# Patient Record
Sex: Male | Born: 1965
Health system: Southern US, Community
[De-identification: ages and names within clinical notes are randomized; demographics above are authoritative.]

## PROBLEM LIST (undated history)

## (undated) DIAGNOSIS — R519 Headache, unspecified: Secondary | ICD-10-CM

## (undated) DIAGNOSIS — G8929 Other chronic pain: Secondary | ICD-10-CM

## (undated) DIAGNOSIS — F329 Major depressive disorder, single episode, unspecified: Secondary | ICD-10-CM

## (undated) DIAGNOSIS — R51 Headache: Secondary | ICD-10-CM

## (undated) DIAGNOSIS — F32A Depression, unspecified: Secondary | ICD-10-CM

## (undated) DIAGNOSIS — G43109 Migraine with aura, not intractable, without status migrainosus: Secondary | ICD-10-CM

## (undated) DIAGNOSIS — F325 Major depressive disorder, single episode, in full remission: Secondary | ICD-10-CM

## (undated) DIAGNOSIS — L409 Psoriasis, unspecified: Secondary | ICD-10-CM

## (undated) HISTORY — DX: Major depressive disorder, single episode, unspecified: F32.9

## (undated) HISTORY — DX: Headache: R51

## (undated) HISTORY — DX: Depression, unspecified: F32.A

## (undated) HISTORY — DX: Headache, unspecified: R51.9

---

## 2008-06-14 ENCOUNTER — Emergency Department (HOSPITAL_BASED_OUTPATIENT_CLINIC_OR_DEPARTMENT_OTHER): Admission: EM | Admit: 2008-06-14 | Discharge: 2008-06-14 | Payer: Self-pay | Admitting: Emergency Medicine

## 2008-08-03 ENCOUNTER — Encounter: Admission: RE | Admit: 2008-08-03 | Discharge: 2008-08-03 | Payer: Self-pay | Admitting: Family Medicine

## 2008-12-27 IMAGING — CR DG SHOULDER 2+V*L*
3 series · 3 of 3 positions shown · non-contrast
Comparison: None

CLINICAL DATA: Pain, no recent trauma

LEFT SHOULDER - 2+ VIEW

[w shoulder ap internal left]
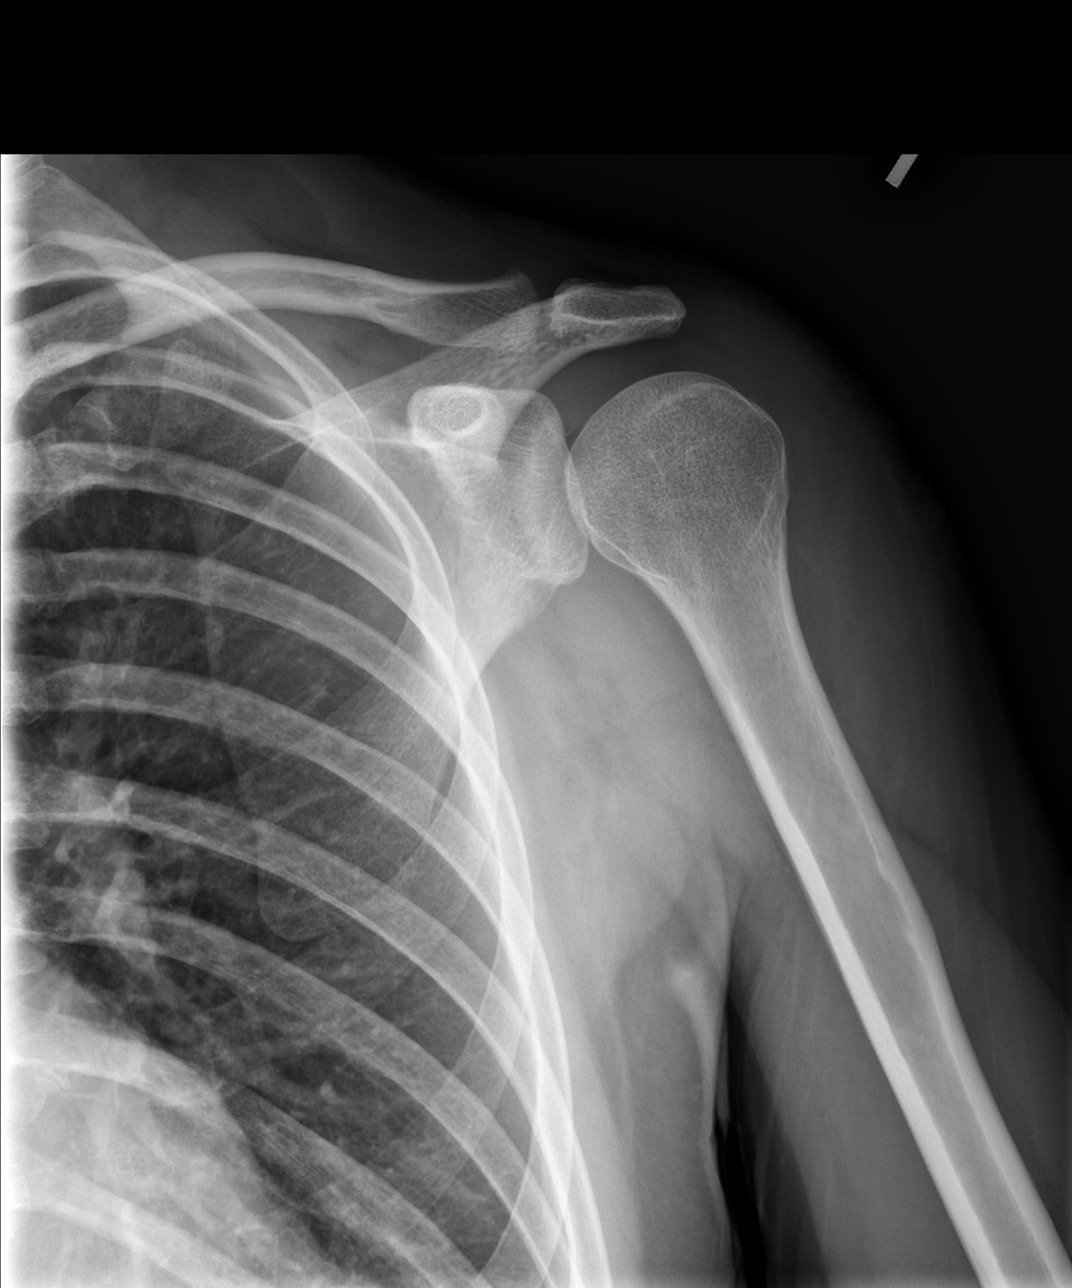

[w shoulder ap external left]
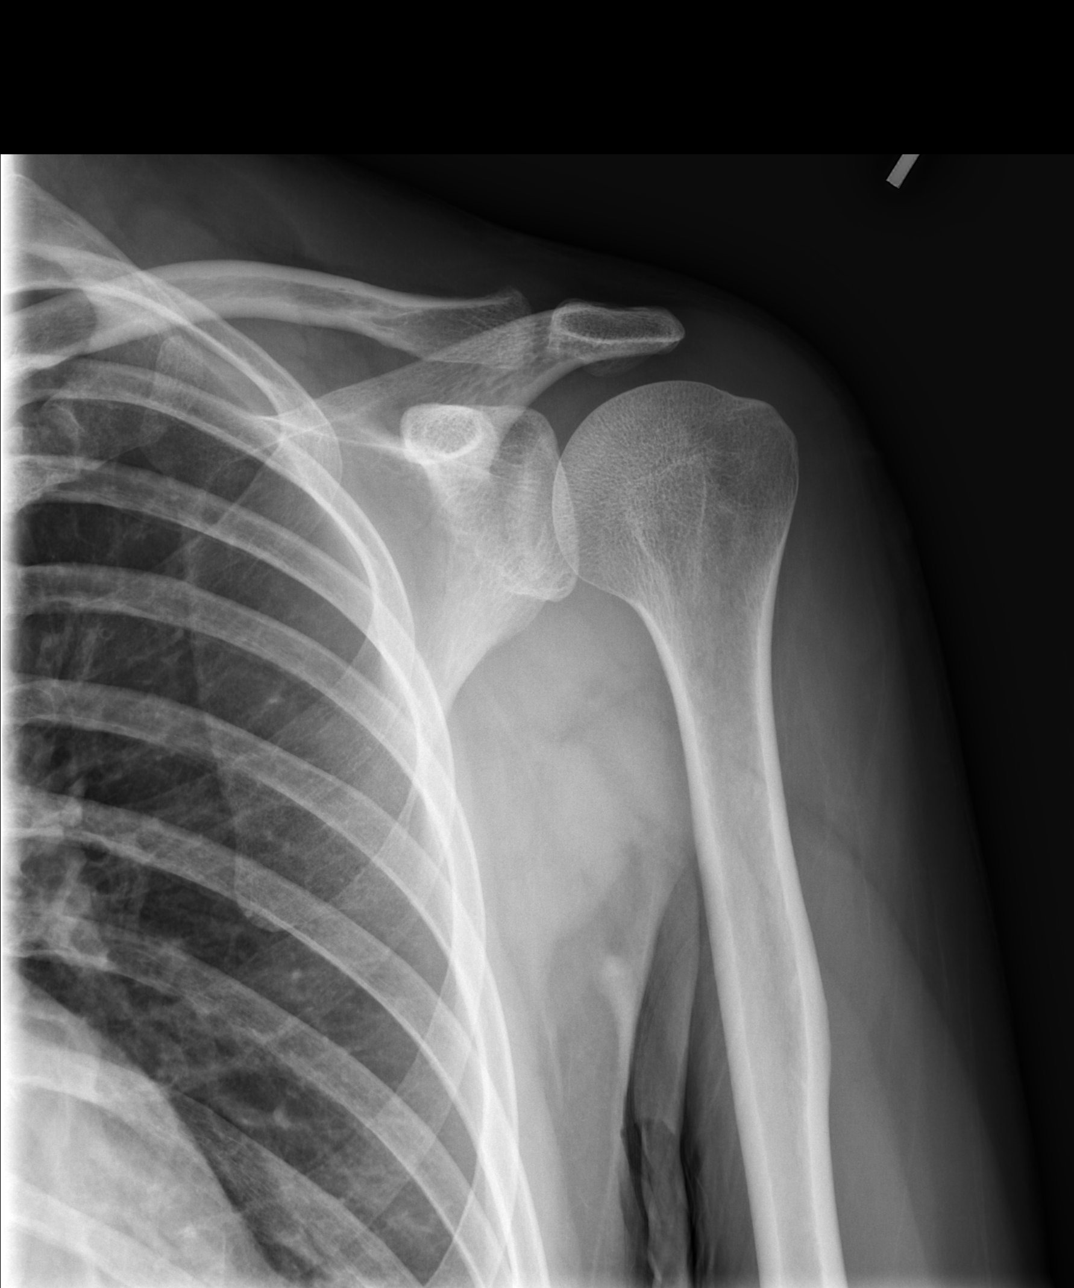

[w shoulder axillary left *]
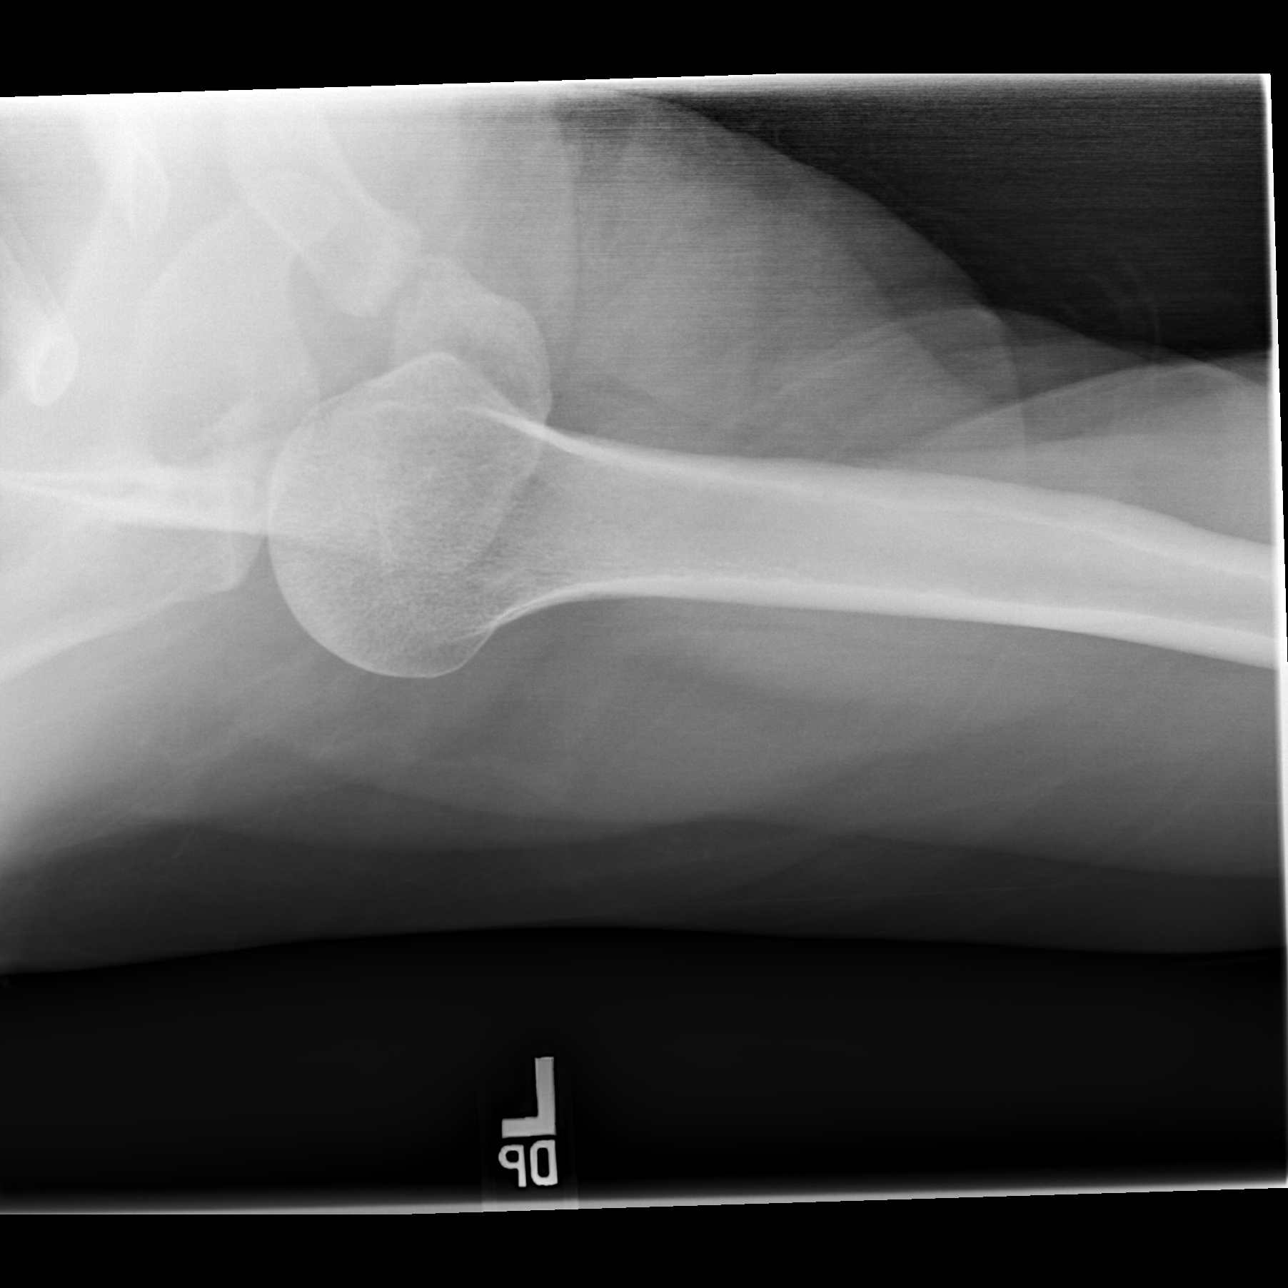

[3 of 3 positions shown; findings below may reference images not displayed]

FINDINGS: The glenohumeral joint space appears normal.  The AC
joint is normally aligned.  No acute bony abnormality is seen
IMPRESSION: .  Negative

## 2011-12-11 ENCOUNTER — Encounter: Payer: Self-pay | Admitting: Internal Medicine

## 2011-12-11 ENCOUNTER — Telehealth: Payer: Self-pay | Admitting: Internal Medicine

## 2011-12-11 ENCOUNTER — Ambulatory Visit (INDEPENDENT_AMBULATORY_CARE_PROVIDER_SITE_OTHER): Payer: Self-pay | Admitting: Internal Medicine

## 2011-12-11 VITALS — BP 110/80 | HR 75 | Temp 98.5°F | Resp 18 | Ht 69.0 in | Wt 197.0 lb

## 2011-12-11 DIAGNOSIS — Z0000 Encounter for general adult medical examination without abnormal findings: Secondary | ICD-10-CM

## 2011-12-11 DIAGNOSIS — J329 Chronic sinusitis, unspecified: Secondary | ICD-10-CM

## 2011-12-11 DIAGNOSIS — F329 Major depressive disorder, single episode, unspecified: Secondary | ICD-10-CM

## 2011-12-11 DIAGNOSIS — F3289 Other specified depressive episodes: Secondary | ICD-10-CM

## 2011-12-11 DIAGNOSIS — F32A Depression, unspecified: Secondary | ICD-10-CM | POA: Insufficient documentation

## 2011-12-11 HISTORY — PX: NO PAST SURGERIES: SHX2092

## 2011-12-11 MED ORDER — FLUTICASONE PROPIONATE 50 MCG/ACT NA SUSP: 2.0000 | Freq: Every day | NASAL | Status: DC

## 2011-12-11 MED ORDER — LEVOFLOXACIN 500 MG PO TABS: 500.0000 mg | ORAL_TABLET | Freq: Every day | ORAL | Status: AC

## 2011-12-11 NOTE — Assessment & Plan Note (Signed)
Attempt course of levaquin and flonase. Followup if no improvement or worsening.

## 2011-12-11 NOTE — Patient Instructions (Signed)
Please schedule a physical at your convenience with fasting labs Cbc, chem7, a1c, lipid, lft, tsh, ua with reflex v70.0

## 2011-12-11 NOTE — Progress Notes (Signed)
  Subjective:    Patient ID: Bradley Richards, male    DOB: 1965/12/28, 46 y.o.   MRN: [id number]  HPI Pt presents to clinic for evaluation of possible sinus infection. Was treated in January for sinusitis with zpak with subsequent but no resolution of sx's. Sinus pressure/pain resolved. Now notes over one week hx of ST with frontal headaches. No fever, chills or teeth pain. Has recent mild cough productive for tan sputum without hemoptysis.  Headaches have occurred over a six month period but are worse during sinus flares. Previously attempted one month of nasonex during sinusitis but none recently. No other alleviating or exacerbating factors. Wishes to schedule physical in the future.  Past Medical History  Diagnosis Date  . Depression     no counseling  . Head ache    Past Surgical History  Procedure Date  . No past surgeries 12/11/2011    reports that he has never smoked. He has never used smokeless tobacco. He reports that he does not drink alcohol or use illicit drugs. family history includes Alcohol abuse in an unspecified family member; Heart disease in his father; Hypothyroidism in his mother; Prostate cancer in his paternal uncle; and Stroke (age of onset:70) in his mother.  There is no history of Hypertension, and Diabetes, and Colon cancer, . No Known Allergies   Review of Systems  Constitutional: Negative for fever and chills.  HENT: Positive for congestion and sore throat. Negative for ear pain and sinus pressure.   Respiratory: Positive for cough.   Neurological: Positive for headaches.  All other systems reviewed and are negative.       Objective:   Physical Exam  Nursing note and vitals reviewed. Constitutional: He appears well-developed and well-nourished. No distress.  HENT:  Head: Normocephalic and atraumatic.  Right Ear: External ear normal.  Left Ear: External ear normal.  Nose: Nose normal.  Mouth/Throat: Oropharynx is clear and moist. No oropharyngeal  exudate.  Eyes: Conjunctivae and EOM are normal. Right eye exhibits no discharge. Left eye exhibits no discharge. No scleral icterus.  Neck: Neck supple.  Pulmonary/Chest: Effort normal and breath sounds normal. No respiratory distress. He has no wheezes. He has no rales.  Lymphadenopathy:    He has no cervical adenopathy.  Neurological: He is alert.  Skin: He is not diaphoretic.  Psychiatric: He has a normal mood and affect.          Assessment & Plan:

## 2011-12-12 NOTE — Telephone Encounter (Signed)
Lab orders entered for April 2013. 

## 2011-12-26 ENCOUNTER — Telehealth: Payer: Self-pay | Admitting: *Deleted

## 2011-12-26 MED ORDER — LEVOFLOXACIN 500 MG PO TABS: 500.0000 mg | ORAL_TABLET | Freq: Every day | ORAL | Status: AC

## 2011-12-26 NOTE — Telephone Encounter (Signed)
Patients wife called and left voice message stating patient was seen for a sinus infection about a week. His symptoms had improved with use of antibiotics. Her message states patient has completed the antibiotics and his symptoms are back. She stated that patient is complaining of sinus congestion and headaches. She stated patient would like to know if he could get another round of antibiotics. He is scheduled to follow up on Monday 01/01/2012 for a physical.

## 2011-12-26 NOTE — Telephone Encounter (Signed)
Ok to repeat levaquin course. Cont flonase. Followup if no improvement or worsening.

## 2011-12-26 NOTE — Telephone Encounter (Signed)
Call placed to patient at  [phone], he was advised per Dr Rodena Medin instructions, and has verbalized understanding.

## 2012-01-01 ENCOUNTER — Encounter: Payer: Self-pay | Admitting: Internal Medicine

## 2012-01-02 ENCOUNTER — Telehealth: Payer: Self-pay | Admitting: Internal Medicine

## 2012-01-02 LAB — BASIC METABOLIC PANEL
BUN: 15 mg/dL (ref 6–23)
Chloride: 105 mEq/L (ref 96–112)
Glucose, Bld: 94 mg/dL (ref 70–99)
Sodium: 140 mEq/L (ref 135–145)

## 2012-01-02 LAB — CBC
Hemoglobin: 14.2 g/dL (ref 13.0–17.0)
MCHC: 34.7 g/dL (ref 30.0–36.0)
RDW: 12.9 % (ref 11.5–15.5)

## 2012-01-02 LAB — TSH: TSH: 2.793 u[IU]/mL (ref 0.350–4.500)

## 2012-01-02 NOTE — Telephone Encounter (Signed)
Pt presented to the lab, future order released and given to the lab. 

## 2012-01-02 NOTE — Telephone Encounter (Signed)
Addended by: Mervin Kung A on: 01/02/2012 09:09 AM   Modules accepted: Orders

## 2012-01-02 NOTE — Telephone Encounter (Signed)
Received medical records from [facility]. Lupe Carney  P: [phone] F: [phone]

## 2012-01-03 LAB — LIPID PANEL
LDL Cholesterol: 127 mg/dL — ABNORMAL HIGH (ref 0–99)
Total CHOL/HDL Ratio: 5.2 Ratio
Triglycerides: 117 mg/dL (ref <150)
VLDL: 23 mg/dL (ref 0–40)

## 2012-01-03 LAB — HEPATIC FUNCTION PANEL
AST: 18 U/L (ref 0–37)
Albumin: 4.4 g/dL (ref 3.5–5.2)
Alkaline Phosphatase: 62 U/L (ref 39–117)
Bilirubin, Direct: 0.1 mg/dL (ref 0.0–0.3)
Indirect Bilirubin: 0.3 mg/dL (ref 0.0–0.9)

## 2012-01-03 LAB — URINALYSIS, ROUTINE W REFLEX MICROSCOPIC
Glucose, UA: NEGATIVE mg/dL
Nitrite: NEGATIVE

## 2012-01-04 ENCOUNTER — Encounter: Payer: 59 | Admitting: Internal Medicine

## 2012-01-16 ENCOUNTER — Ambulatory Visit (INDEPENDENT_AMBULATORY_CARE_PROVIDER_SITE_OTHER): Payer: 59 | Admitting: Internal Medicine

## 2012-01-16 ENCOUNTER — Encounter: Payer: Self-pay | Admitting: Internal Medicine

## 2012-01-16 VITALS — BP 100/64 | HR 65 | Temp 97.9°F | Resp 16 | Ht 69.0 in | Wt 196.0 lb

## 2012-01-16 DIAGNOSIS — Z23 Encounter for immunization: Secondary | ICD-10-CM

## 2012-01-16 DIAGNOSIS — Z0000 Encounter for general adult medical examination without abnormal findings: Secondary | ICD-10-CM

## 2012-01-16 DIAGNOSIS — H6121 Impacted cerumen, right ear: Secondary | ICD-10-CM

## 2012-01-16 DIAGNOSIS — H6120 Impacted cerumen, unspecified ear: Secondary | ICD-10-CM

## 2012-01-16 NOTE — Patient Instructions (Signed)
Please schedule fasting cpe labs prior to next visit v70.0

## 2012-02-29 ENCOUNTER — Telehealth: Payer: Self-pay | Admitting: Internal Medicine

## 2012-02-29 MED ORDER — SERTRALINE HCL 50 MG PO TABS: 50.0000 mg | ORAL_TABLET | Freq: Every day | ORAL | Status: DC

## 2012-02-29 NOTE — Telephone Encounter (Signed)
Call placed to patient at [phone], he was informed of Rx approval to pharmacy. He was advised when he gets back in town to have his Rx's transferred to his local pharmacy. Patient verbalized understanding and agrees as instructed.

## 2012-02-29 NOTE — Telephone Encounter (Signed)
[id number] 

## 2012-02-29 NOTE — Telephone Encounter (Signed)
Patient was prescribed this from his former doctor he has no more refills on that rx.  He is out of town and out of meds.  Please call rx in to [facility] [place] [phone]

## 2012-03-11 ENCOUNTER — Ambulatory Visit (INDEPENDENT_AMBULATORY_CARE_PROVIDER_SITE_OTHER): Payer: 59 | Admitting: Internal Medicine

## 2012-03-11 VITALS — BP 98/80 | HR 61 | Temp 98.4°F | Wt 198.0 lb

## 2012-03-11 DIAGNOSIS — J029 Acute pharyngitis, unspecified: Secondary | ICD-10-CM

## 2012-03-11 DIAGNOSIS — R51 Headache: Secondary | ICD-10-CM

## 2012-03-11 DIAGNOSIS — G8929 Other chronic pain: Secondary | ICD-10-CM

## 2012-03-11 MED ORDER — TOPIRAMATE 25 MG PO TABS: ORAL_TABLET | ORAL | Status: DC

## 2012-03-11 NOTE — Progress Notes (Signed)
  Subjective:    Patient ID: Bradley Richards, male    DOB: 1965/10/16, 46 y.o.   MRN: [id number]  HPI Pt presents to clinic for evaluation of headaches. Notes 8-9 month h/o ha worsening over the past 2 months. No neurologic sx's and do not wake him up at night. Had eye exam during this period. Severity ranges from dull to 6/10. Has rare associated nausea. Location primarily frontal without sinus/rhinitis sx's. Also has 2d h/o ST and myalgias. No f/c or strep exposure.   Past Medical History  Diagnosis Date  . Depression     no counseling  . Head ache    Past Surgical History  Procedure Date  . No past surgeries 12/11/2011    reports that he has never smoked. He has never used smokeless tobacco. He reports that he does not drink alcohol or use illicit drugs. family history includes Alcohol abuse in an unspecified family member; Heart disease in his father; Hypothyroidism in his mother; Prostate cancer in his paternal uncle; and Stroke (age of onset:70) in his mother.  There is no history of Hypertension, and Diabetes, and Colon cancer, . No Known Allergies   Review of Systems see hpi     Objective:   Physical Exam  Nursing note and vitals reviewed. Constitutional: He is oriented to person, place, and time. He appears well-developed and well-nourished. No distress.  HENT:  Head: Normocephalic and atraumatic.  Right Ear: External ear normal.  Left Ear: External ear normal.  Nose: Nose normal.  Mouth/Throat: Mucous membranes are normal. Posterior oropharyngeal erythema present. No posterior oropharyngeal edema or tonsillar abscesses.  Eyes: Conjunctivae and EOM are normal. Pupils are equal, round, and reactive to light.  Neck: Neck supple.  Neurological: He is alert and oriented to person, place, and time. No cranial nerve deficit. Coordination normal.  Skin: Skin is warm and dry. He is not diaphoretic.  Psychiatric: He has a normal mood and affect.          Assessment & Plan:

## 2012-03-16 ENCOUNTER — Encounter: Payer: Self-pay | Admitting: Internal Medicine

## 2012-03-16 DIAGNOSIS — J029 Acute pharyngitis, unspecified: Secondary | ICD-10-CM | POA: Insufficient documentation

## 2012-03-16 DIAGNOSIS — G8929 Other chronic pain: Secondary | ICD-10-CM | POA: Insufficient documentation

## 2012-03-16 NOTE — Assessment & Plan Note (Signed)
Rapid strep obtained and neg. Followup if no improvement or worsening.

## 2012-03-16 NOTE — Assessment & Plan Note (Signed)
Neurologically nonfocal. Improve sleep and stress levels. Reduce caffeine. Begin topamax. Schedule close f/u.

## 2012-03-18 ENCOUNTER — Telehealth: Payer: Self-pay | Admitting: *Deleted

## 2012-03-18 NOTE — Telephone Encounter (Signed)
[facility] Triage Call Report Triage Record Num: [id number] Operator: Tomasita Crumble Patient Name: Bradley Richards Call Date & Time: 03/16/2012 11:35:01AM Patient Phone: [phone] PCP: Marguarite Arbour Patient Gender: Male PCP Fax : [phone] Patient DOB: Nov 10, 1965 Practice Name: [facility] [place] [facility] Reason for Call: Caller: [name]/Patient; PCP: Marguarite Arbour; CB#: [phone]; Call regarding Patient wants something called in for sinus infection. On topamax for headaches. States he is currently in [place], [place] and has sinus congestion. Excedrin does not relieve headache. Onset 03/14/12. Clear to tan sputum. Advised see in 24 hours per URI protocol. Per office instructions patient needs evaluation. Advised urgent care facility or go to [facility] location before 1300. Home care for the interim and parameters for callback given. Caller states he is out of state and will seek care elsewhere. Protocol(s) Used: Upper Respiratory Infection (URI) Recommended Outcome per Protocol: See Provider within 24 hours Reason for Outcome: Productive cough with colored sputum (other than clear or white sputum) Care Advice: ~ Use a cool mist humidifier to moisten air. Be sure to clean according to manufacturer's instructions. ~ May inhale steam from hot shower or heated water. Be careful to avoid burns. Increase fluids to 8-12 eight oz (1.6 to 2.4 liters) glasses per day, half of them to be water. Soups, popsicles, fruit juices, non-caffeinated sodas (unless restricting sodium intake), jello, broths, decaf teas, etc. are all okay. Warm fluids can be soothing. ~ ~ Warm fluids may help, or try a mixture of honey and lemon juice in warm tea. Coughing up mucus or phlegm helps to get rid of an infection. A productive cough should not be stopped. A cough medicine with guaifenesin (Robitussin, Mucinex) can help loosen the mucus. Cough medicine with dextromethorphan (DM) should be avoided. Drinking lots of fluids can help  loosen the mucus too, especially warm fluids. ~ Analgesic/Antipyretic Advice - Acetaminophen: Consider acetaminophen as directed on label or by pharmacist/provider for pain or fever PRECAUTIONS: - Use if there is no history of liver disease, alcoholism, or intake of three or more alcohol drinks per day - Only if approved by provider during pregnancy or when breastfeeding - During pregnancy, acetaminophen should not be taken more than 3 consecutive days without telling provider - Do not exceed recommended dose or frequency

## 2012-04-09 ENCOUNTER — Ambulatory Visit: Payer: 59 | Admitting: Internal Medicine

## 2012-04-18 ENCOUNTER — Ambulatory Visit: Payer: 59 | Admitting: Internal Medicine

## 2012-06-12 ENCOUNTER — Ambulatory Visit (INDEPENDENT_AMBULATORY_CARE_PROVIDER_SITE_OTHER): Payer: 59 | Admitting: Internal Medicine

## 2012-06-12 ENCOUNTER — Encounter: Payer: Self-pay | Admitting: Internal Medicine

## 2012-06-12 VITALS — BP 108/76 | HR 51 | Temp 98.1°F | Wt 199.4 lb

## 2012-06-12 DIAGNOSIS — J069 Acute upper respiratory infection, unspecified: Secondary | ICD-10-CM

## 2012-06-12 MED ORDER — AZITHROMYCIN 250 MG PO TABS: ORAL_TABLET | ORAL | Status: AC

## 2012-06-22 DIAGNOSIS — J069 Acute upper respiratory infection, unspecified: Secondary | ICD-10-CM | POA: Insufficient documentation

## 2012-06-22 NOTE — Progress Notes (Signed)
  Subjective:    Patient ID: Bradley Richards, male    DOB: July 25, 1966, 46 y.o.   MRN: [id number]  HPI patient presents to clinic for evaluation of cough. Notes several day history of sore throat, mild headache, cough and nasal congestion. No wheezing, shortness of breath fever or chills. No alleviating or exacerbating factors. No other complaints.  Past Medical History  Diagnosis Date  . Depression     no counseling  . Head ache    Past Surgical History  Procedure Date  . No past surgeries 12/11/2011    reports that he has never smoked. He has never used smokeless tobacco. He reports that he does not drink alcohol or use illicit drugs. family history includes Alcohol abuse in an unspecified family member; Heart disease in his father; Hypothyroidism in his mother; Prostate cancer in his paternal uncle; and Stroke (age of onset:70) in his mother.  There is no history of Hypertension, and Diabetes, and Colon cancer, . No Known Allergies   Review of Systems see history of present illness     Objective:   Physical Exam  Nursing note and vitals reviewed. Constitutional: He appears well-developed and well-nourished. No distress.  HENT:  Head: Normocephalic and atraumatic.  Right Ear: Tympanic membrane, external ear and ear canal normal.  Left Ear: Tympanic membrane, external ear and ear canal normal.  Nose: Nose normal.  Mouth/Throat: Oropharynx is clear and moist. No oropharyngeal exudate.  Eyes: Conjunctivae normal are normal. Right eye exhibits no discharge. Left eye exhibits no discharge. No scleral icterus.  Neck: Neck supple.  Pulmonary/Chest: Effort normal and breath sounds normal.  Lymphadenopathy:    He has no cervical adenopathy.  Neurological: He is alert.  Skin: Skin is warm and dry. He is not diaphoretic.  Psychiatric: He has a normal mood and affect.          Assessment & Plan:

## 2012-06-22 NOTE — Assessment & Plan Note (Signed)
Given antibiotic to hold. Begin if symptoms do not improve after total duration of 8-10 days. Followup if no improvement or worsening.

## 2012-07-31 ENCOUNTER — Ambulatory Visit (INDEPENDENT_AMBULATORY_CARE_PROVIDER_SITE_OTHER): Payer: 59 | Admitting: Internal Medicine

## 2012-07-31 ENCOUNTER — Encounter: Payer: Self-pay | Admitting: Internal Medicine

## 2012-07-31 VITALS — HR 82

## 2012-07-31 DIAGNOSIS — J309 Allergic rhinitis, unspecified: Secondary | ICD-10-CM

## 2012-07-31 DIAGNOSIS — J069 Acute upper respiratory infection, unspecified: Secondary | ICD-10-CM

## 2012-07-31 MED ORDER — AZITHROMYCIN 250 MG PO TABS: ORAL_TABLET | ORAL | Status: AC

## 2012-07-31 MED ORDER — MOMETASONE FUROATE 50 MCG/ACT NA SUSP: 2.0000 | Freq: Every day | NASAL | Status: DC

## 2012-07-31 NOTE — Progress Notes (Signed)
  Subjective:    Patient ID: Bradley Richards, male    DOB: 1965-09-25, 46 y.o.   MRN: [id number]  HPI Pt presents to clinic for evaluation of URI sx's. Notes 6 day h/o nasal drainage and congestion without f/c or cough. Attempting otc AH prn. Notes also chronic intermittent allergic rhinitis sx's. Takes inhaled steroid periodically-finds nasonex less irritating than flonase. Wonders about possible allergic triggers.  Past Medical History  Diagnosis Date  . Depression     no counseling  . Head ache    Past Surgical History  Procedure Date  . No past surgeries 12/11/2011    reports that he has never smoked. He has never used smokeless tobacco. He reports that he does not drink alcohol or use illicit drugs. family history includes Alcohol abuse in an unspecified family member; Heart disease in his father; Hypothyroidism in his mother; Prostate cancer in his paternal uncle; and Stroke (age of onset:70) in his mother.  There is no history of Hypertension, and Diabetes, and Colon cancer, . No Known Allergies    Review of Systems see hpi     Objective:   Physical Exam  Constitutional: He appears well-developed and well-nourished. No distress.  HENT:  Head: Normocephalic and atraumatic.  Right Ear: Tympanic membrane, external ear and ear canal normal.  Left Ear: Tympanic membrane, external ear and ear canal normal.  Nose: Nose normal.  Mouth/Throat: Oropharynx is clear and moist. No oropharyngeal exudate.  Eyes: Conjunctivae normal are normal. No scleral icterus.  Neck: Neck supple.  Pulmonary/Chest: Effort normal and breath sounds normal.  Skin: He is not diaphoretic.          Assessment & Plan:

## 2012-07-31 NOTE — Assessment & Plan Note (Signed)
Change flonase to nasonex and take regularly. Consider allergy consult if sx's persist.

## 2012-07-31 NOTE — Assessment & Plan Note (Signed)
Discussed viral vs bacterial etiology and favor viral currently. Given printed abx to hold. Begin abx if sx's do not improve after total duration of 8-10 days. Followup if no improvement or worsening.

## 2012-08-13 ENCOUNTER — Other Ambulatory Visit: Payer: Self-pay | Admitting: *Deleted

## 2012-08-13 MED ORDER — MOMETASONE FUROATE 50 MCG/ACT NA SUSP: 2.0000 | Freq: Every day | NASAL | Status: DC

## 2012-08-13 NOTE — Progress Notes (Signed)
Sent to incorrect pharmacy. Rx re-sent.

## 2012-09-23 DIAGNOSIS — Z0000 Encounter for general adult medical examination without abnormal findings: Secondary | ICD-10-CM | POA: Insufficient documentation

## 2012-09-23 NOTE — Progress Notes (Signed)
  Subjective:    Patient ID: Bradley Richards, male    DOB: 16-Dec-1965, 47 y.o.   MRN: [id number]  HPI Pt presents to clinic for annual exam. Reviewed recent labs.  Past Medical History  Diagnosis Date  . Depression     no counseling  . Head ache    Past Surgical History  Procedure Date  . No past surgeries 12/11/2011    reports that he has never smoked. He has never used smokeless tobacco. He reports that he does not drink alcohol or use illicit drugs. family history includes Alcohol abuse in an unspecified family member; Heart disease in his father; Hypothyroidism in his mother; Prostate cancer in his paternal uncle; and Stroke (age of onset:70) in his mother.  There is no history of Hypertension, and Diabetes, and Colon cancer, . No Known Allergies   Review of Systems see hpi     Objective:   Physical Exam  Physical Exam  Nursing note and vitals reviewed. Constitutional: He appears well-developed and well-nourished. No distress.  HENT:  Head: Normocephalic and atraumatic.  Right Ear: Right ear canal obstructed with cerumen Left Ear: Tympanic membrane and external ear normal.  Nose: Nose normal.  Mouth/Throat: Uvula is midline, oropharynx is clear and moist and mucous membranes are normal. No oropharyngeal exudate.  Eyes: Conjunctivae and EOM are normal. Pupils are equal, round, and reactive to light. Right eye exhibits no discharge. Left eye exhibits no discharge. No scleral icterus.  Neck: Neck supple. Carotid bruit is not present. No thyromegaly present.  Cardiovascular: Normal rate, regular rhythm and normal heart sounds.  Exam reveals no gallop and no friction rub.   No murmur heard. Pulmonary/Chest: Effort normal and breath sounds normal. No respiratory distress. He has no wheezes. He has no rales.  Abdominal: Soft. He exhibits no distension and no mass. There is no hepatosplenomegaly. There is no tenderness. There is no rebound. Hernia confirmed negative in the right  inguinal area and confirmed negative in the left inguinal area.  Lymphadenopathy:    He has no cervical adenopathy.  Neurological: He is alert.  Skin: Skin is warm and dry. He is not diaphoretic.  Psychiatric: He has a normal mood and affect.        Assessment & Plan:

## 2012-09-23 NOTE — Assessment & Plan Note (Signed)
Labs reviewed. Attempt right ear canal flushing.

## 2012-10-19 ENCOUNTER — Other Ambulatory Visit: Payer: Self-pay

## 2013-03-15 ENCOUNTER — Other Ambulatory Visit: Payer: Self-pay | Admitting: Internal Medicine

## 2013-03-17 NOTE — Telephone Encounter (Signed)
Left a detailed message on vm.  RX sent to pharmacy

## 2013-03-17 NOTE — Telephone Encounter (Signed)
eScribe request for refill on Sertraline Last filled - 06.27.2013, #30x6 Last AEX - 05.14.13, [Acute only 11.27.13]  Next AEX - 1-Yr, No Appt scheduled Please advise on refills/SLS

## 2013-03-17 NOTE — Telephone Encounter (Signed)
Can have Sertraline refill 50 mg daily, Disp #30 with 2 refills but then he needs to get in for annual exam to stay on this med

## 2013-05-07 ENCOUNTER — Ambulatory Visit (INDEPENDENT_AMBULATORY_CARE_PROVIDER_SITE_OTHER): Payer: 59 | Admitting: Physician Assistant

## 2013-05-07 ENCOUNTER — Other Ambulatory Visit: Payer: Self-pay | Admitting: *Deleted

## 2013-05-07 ENCOUNTER — Encounter: Payer: Self-pay | Admitting: Physician Assistant

## 2013-05-07 VITALS — BP 108/82 | HR 70 | Temp 98.2°F | Resp 16 | Ht 69.0 in | Wt 203.2 lb

## 2013-05-07 DIAGNOSIS — J309 Allergic rhinitis, unspecified: Secondary | ICD-10-CM

## 2013-05-07 DIAGNOSIS — Z0000 Encounter for general adult medical examination without abnormal findings: Secondary | ICD-10-CM

## 2013-05-07 DIAGNOSIS — F32A Depression, unspecified: Secondary | ICD-10-CM

## 2013-05-07 DIAGNOSIS — F329 Major depressive disorder, single episode, unspecified: Secondary | ICD-10-CM

## 2013-05-07 DIAGNOSIS — F3289 Other specified depressive episodes: Secondary | ICD-10-CM

## 2013-05-07 LAB — BASIC METABOLIC PANEL
BUN: 15 mg/dL (ref 6–23)
CO2: 29 mEq/L (ref 19–32)
Calcium: 9.3 mg/dL (ref 8.4–10.5)
Chloride: 103 mEq/L (ref 96–112)
Creat: 1.06 mg/dL (ref 0.50–1.35)

## 2013-05-07 LAB — CBC WITH DIFFERENTIAL/PLATELET
Basophils Absolute: 0 10*3/uL (ref 0.0–0.1)
Basophils Relative: 1 % (ref 0–1)
Eosinophils Absolute: 0.2 10*3/uL (ref 0.0–0.7)
Eosinophils Relative: 3 % (ref 0–5)
MCH: 31.1 pg (ref 26.0–34.0)
MCHC: 35.3 g/dL (ref 30.0–36.0)
Monocytes Absolute: 0.7 10*3/uL (ref 0.1–1.0)
Monocytes Relative: 11 % (ref 3–12)
Neutro Abs: 3.2 10*3/uL (ref 1.7–7.7)
Neutrophils Relative %: 53 % (ref 43–77)
Platelets: 196 10*3/uL (ref 150–400)
RDW: 13.1 % (ref 11.5–15.5)

## 2013-05-07 LAB — HEPATIC FUNCTION PANEL
ALT: 38 U/L (ref 0–53)
AST: 26 U/L (ref 0–37)
Alkaline Phosphatase: 57 U/L (ref 39–117)
Indirect Bilirubin: 0.4 mg/dL (ref 0.0–0.9)
Total Protein: 6.8 g/dL (ref 6.0–8.3)

## 2013-05-07 LAB — LIPID PANEL
HDL: 37 mg/dL — ABNORMAL LOW (ref >39)
LDL Cholesterol: 91 mg/dL (ref 0–99)
VLDL: 43 mg/dL — ABNORMAL HIGH (ref 0–40)

## 2013-05-07 LAB — HEMOGLOBIN A1C: Hgb A1c MFr Bld: 5.4 % (ref <5.7)

## 2013-05-07 MED ORDER — MOMETASONE FUROATE 50 MCG/ACT NA SUSP: 2.0000 | Freq: Every day | NASAL | Status: DC | PRN

## 2013-05-07 MED ORDER — FLUTICASONE PROPIONATE 50 MCG/ACT NA SUSP: 2.0000 | Freq: Every day | NASAL | Status: AC | PRN

## 2013-05-07 MED ORDER — SERTRALINE HCL 50 MG PO TABS: ORAL_TABLET | ORAL | Status: DC

## 2013-05-07 NOTE — Progress Notes (Signed)
Patient ID: Bradley Richards, male   DOB: May 27, 1966, 47 y.o.   MRN: [id number]  Patient presents to clinic today for annual examination.  Information was obtained from the patient.    Acute Concerns: No concerns at present time.  Patient does request refills of nasonex and sertraline  Chronic Issues: (1) Allergic Rhinitis --  Patient's symptoms are controlled with nasonex.  Has occasional break through symptoms but does not feel they impact his daily functioning.  (2) Depression --  Patient's depression is controlled with medication.  Patient endorses taking sertraline daily as prescribed.  Denies depressed mood, anhedonia, SI.  Denies GI or GU side effects of medication.    Health Maintenance: Eye Examination -- Last checkup 1.5 years ago.  Patient educated on importance of annual eye exams.   Dental -- Patient has schedules cleaning for next week.  Sees dentist twice per year Immunizations -- Up-to-date.  Denies influenza vaccination at present time.   Past Medical History  Diagnosis Date  . Depression     no counseling  . Head ache     Current Outpatient Prescriptions on File Prior to Visit  Medication Sig Dispense Refill  . Aspirin-Acetaminophen-Caffeine (EXCEDRIN MIGRAINE PO) Take by mouth.       No current facility-administered medications on file prior to visit.    No Known Allergies  Family History  Problem Relation Age of Onset  . Alcohol abuse      maternal & paternal grandfather, & sister  . Heart disease Father     stent & bypass  . Prostate cancer Paternal Uncle   . Stroke Mother 74  . Hypertension Neg Hx   . Hypothyroidism Mother   . Diabetes Neg Hx   . Colon cancer Neg Hx   . Multiple sclerosis Brother     History   Social History  . Marital Status: Married    Spouse Name: N/A    Number of Children: N/A  . Years of Education: N/A   Social History Main Topics  . Smoking status: Never Smoker   . Smokeless tobacco: Never Used  . Alcohol Use: No  .  Drug Use: No  . Sexual Activity: None   Other Topics Concern  . None   Social History Narrative  . None   Review of Systems  Constitutional: Negative for fever, chills, weight loss and malaise/fatigue.  HENT: Negative for hearing loss, ear pain and tinnitus.   Eyes: Negative for blurred vision, double vision, photophobia and pain.  Respiratory: Negative for cough, shortness of breath and wheezing.   Cardiovascular: Negative for chest pain and palpitations.  Gastrointestinal: Negative for heartburn, nausea, vomiting, abdominal pain, diarrhea, constipation, blood in stool and melena.  Genitourinary: Negative for dysuria, urgency, frequency, hematuria and flank pain.  Musculoskeletal: Negative for myalgias.  Neurological: Positive for headaches. Negative for dizziness and loss of consciousness.  Endo/Heme/Allergies: Positive for environmental allergies.  Psychiatric/Behavioral: Positive for depression. Negative for suicidal ideas and substance abuse. The patient is nervous/anxious. The patient does not have insomnia.    Filed Vitals:   05/07/13 0834  BP: 108/82  Pulse: 70  Temp: 98.2 F (36.8 C)  Resp: 16   Physical Exam  Vitals reviewed. Constitutional: He is oriented to person, place, and time and well-developed, well-nourished, and in no distress.  HENT:  Head: Normocephalic and atraumatic.  Right Ear: External ear normal.  Left Ear: External ear normal.  Nose: Nose normal.  Mouth/Throat: Oropharynx is clear and moist. No oropharyngeal exudate.  TM WNL bilaterally  Eyes: Conjunctivae and EOM are normal. Pupils are equal, round, and reactive to light.  Neck: Normal range of motion. Neck supple.  Cardiovascular: Normal rate, regular rhythm, normal heart sounds and intact distal pulses.   Pulmonary/Chest: Effort normal and breath sounds normal. No respiratory distress. He has no wheezes. He has no rales. He exhibits no tenderness.  Abdominal: Soft. Bowel sounds are normal. He  exhibits no distension and no mass. There is no tenderness. There is no rebound and no guarding.  Musculoskeletal: Normal range of motion.  Lymphadenopathy:    He has no cervical adenopathy.  Neurological: He is alert and oriented to person, place, and time. He has normal reflexes. No cranial nerve deficit.  Skin: Skin is warm and dry. No rash noted.   Assessment/Plan: Allergic rhinitis Refill Nasonex.  Patient's symptoms controlled on current medications  Depression Patient's symptoms controlled with sertraline without side effects or medication intolerance.  Refill for sertraline given.  Follow-up in 6 months  Annual physical exam Fasting labs at today's visit.  Patient declines annual influenza vaccination at present.

## 2013-05-07 NOTE — Telephone Encounter (Signed)
Received fax from [facility] requesting change in drug for Nasonex, as Insurance does not cover this medication, but stated that it would cover Fluticasone. Forwarded fax information to provider; Flonase [fluticasone] was authorized and faxed back to pharmacy. LMOM with contact name and number RE: change made d/t Insurance coverage for patient/SLS

## 2013-05-07 NOTE — Patient Instructions (Signed)
Please obtain labs today.  I will call you with the results.  Want to see you in 6 months for Zoloft refills.  Will have you back sooner if labs are abnormal.  Please call if you need anything.

## 2013-05-07 NOTE — Assessment & Plan Note (Signed)
Patient's symptoms controlled with sertraline without side effects or medication intolerance.  Refill for sertraline given.  Follow-up in 6 months

## 2013-05-07 NOTE — Assessment & Plan Note (Signed)
Fasting labs at today's visit.  Patient declines annual influenza vaccination at present.

## 2013-05-07 NOTE — Assessment & Plan Note (Signed)
Refill Nasonex.  Patient's symptoms controlled on current medications

## 2013-05-08 LAB — URINALYSIS, ROUTINE W REFLEX MICROSCOPIC: Nitrite: NEGATIVE

## 2013-07-04 ENCOUNTER — Ambulatory Visit (INDEPENDENT_AMBULATORY_CARE_PROVIDER_SITE_OTHER): Payer: 59 | Admitting: Physician Assistant

## 2013-07-04 ENCOUNTER — Encounter: Payer: Self-pay | Admitting: Physician Assistant

## 2013-07-04 VITALS — BP 116/82 | HR 72 | Temp 97.8°F | Resp 18 | Ht 69.0 in | Wt 203.5 lb

## 2013-07-04 DIAGNOSIS — H6691 Otitis media, unspecified, right ear: Secondary | ICD-10-CM

## 2013-07-04 DIAGNOSIS — J329 Chronic sinusitis, unspecified: Secondary | ICD-10-CM

## 2013-07-04 DIAGNOSIS — T148XXA Other injury of unspecified body region, initial encounter: Secondary | ICD-10-CM

## 2013-07-04 DIAGNOSIS — H6690 Otitis media, unspecified, unspecified ear: Secondary | ICD-10-CM

## 2013-07-04 DIAGNOSIS — R062 Wheezing: Secondary | ICD-10-CM

## 2013-07-04 MED ORDER — ALBUTEROL SULFATE HFA 108 (90 BASE) MCG/ACT IN AERS: 2.0000 | INHALATION_SPRAY | Freq: Four times a day (QID) | RESPIRATORY_TRACT | Status: DC | PRN

## 2013-07-04 MED ORDER — AMOXICILLIN-POT CLAVULANATE 875-125 MG PO TABS: 1.0000 | ORAL_TABLET | Freq: Two times a day (BID) | ORAL | Status: DC

## 2013-07-04 MED ORDER — CYCLOBENZAPRINE HCL 10 MG PO TABS: 10.0000 mg | ORAL_TABLET | Freq: Three times a day (TID) | ORAL | Status: DC | PRN

## 2013-07-04 NOTE — Patient Instructions (Signed)
Please take medications as prescribed.  For Sinus congestion/Cough/Ear Pain -- Take augmentin twice daily for 10 days.  Take a daily probiotic.  Increase fluids.  Rest.  Continue Flonase.  Daily antihistamine. Humidifier in bedroom.  For Muscle Pain -- take flexeril 1/2 tablet during the day and 1 tablet at bedtime.  Continue ibuprofen.  Use topical Salon Pas or Aspercrme.  Warm compresses.  Rest.  Follow-up if symptoms are not improving.

## 2013-07-04 NOTE — Progress Notes (Signed)
Patient ID: Bradley Richards, male   DOB: 1965-10-03, 47 y.o.   MRN: [id number]  Patient presents to clinic today c/o head congestion, sinus pressure, sinus pain, cough productive of clear-beige sputum and R ear pain x 3-4 days.  Patient denies fever, chills, sweats.  Denies recent sick contact.  Denies history of asthma but endorses history of allergies.  Patient has tried OTC medications for symptom relief.  Is mostly concerned about ear pain.  Denies tinnitus, ear drainage, or change in hearing.  Denies exposure to loud noises.  Patient also complains of left-sided LBP x 1 day, after pulling a muscle while lifting a box.  States he made the pain worse after wrestling with his kids, pulling his muscle more.  Has taken some ibuprofen with some relief of symptoms.   Past Medical History  Diagnosis Date  . Depression     no counseling  . Head ache     Current Outpatient Prescriptions on File Prior to Visit  Medication Sig Dispense Refill  . Aspirin-Acetaminophen-Caffeine (EXCEDRIN MIGRAINE PO) Take by mouth.      . fluticasone (FLONASE) 50 MCG/ACT nasal spray Place 2 sprays into the nose daily as needed for rhinitis.  16 g  3  . sertraline (ZOLOFT) 50 MG tablet TAKE 1 TABLET BY MOUTH EVERY DAY  30 tablet  2   No current facility-administered medications on file prior to visit.    No Known Allergies  Family History  Problem Relation Age of Onset  . Alcohol abuse      maternal & paternal grandfather, & sister  . Heart disease Father     stent & bypass  . Prostate cancer Paternal Uncle   . Stroke Mother 41  . Hypertension Neg Hx   . Hypothyroidism Mother   . Diabetes Neg Hx   . Colon cancer Neg Hx   . Multiple sclerosis Brother     History   Social History  . Marital Status: Married    Spouse Name: N/A    Number of Children: N/A  . Years of Education: N/A   Social History Main Topics  . Smoking status: Never Smoker   . Smokeless tobacco: Never Used  . Alcohol Use: No  .  Drug Use: No  . Sexual Activity: None   Other Topics Concern  . None   Social History Narrative  . None   ROS See HPI.  All other ROS are negative.   Filed Vitals:   07/04/13 0921  BP: 116/82  Pulse: 72  Temp: 97.8 F (36.6 C)  Resp: 18   Physical Exam  Vitals reviewed. Constitutional: He is oriented to person, place, and time and well-developed, well-nourished, and in no distress.  HENT:  Head: Normocephalic and atraumatic.  Right Ear: External ear normal.  Left Ear: External ear normal.  Nose: Nose normal.  Mouth/Throat: Oropharynx is clear and moist. No oropharyngeal exudate.  Left tympanic membrane within normal limits.  Right tympanic membrane is erythematous, bulging.  Mild tenderness to percussion of sinuses.  Eyes: Conjunctivae are normal.  Neck: Neck supple.  Cardiovascular: Normal rate, regular rhythm, normal heart sounds and intact distal pulses.   Pulmonary/Chest: Effort normal and breath sounds normal. No respiratory distress. He has no rales. He exhibits no tenderness.  Faint wheeze noted of bilateral lower lobes.  Musculoskeletal:  Tenderness of right perispinal musculature of lumbar spine with noted muscle spasm.  No bony tenderness or gross abnormality noted.  Pain is worsened with extension/flexion/lateral bending  of lumbar spine.  Lymphadenopathy:    He has no cervical adenopathy.  Neurological: He is alert and oriented to person, place, and time.  Skin: Skin is warm and dry. No rash noted.     Recent Results (from the past 2160 hour(s))  CBC WITH DIFFERENTIAL     Status: None   Collection Time    05/07/13 12:03 PM      Result Value Range   WBC 6.0  4.0 - 10.5 K/uL   RBC 4.85  4.22 - 5.81 MIL/uL   Hemoglobin 15.1  13.0 - 17.0 g/dL   HCT 40.9  81.1 - 91.4 %   MCV 88.2  78.0 - 100.0 fL   MCH 31.1  26.0 - 34.0 pg   MCHC 35.3  30.0 - 36.0 g/dL   RDW 78.2  95.6 - 21.3 %   Platelets 196  150 - 400 K/uL   Neutrophils Relative % 53  43 - 77 %    Neutro Abs 3.2  1.7 - 7.7 K/uL   Lymphocytes Relative 32  12 - 46 %   Lymphs Abs 1.9  0.7 - 4.0 K/uL   Monocytes Relative 11  3 - 12 %   Monocytes Absolute 0.7  0.1 - 1.0 K/uL   Eosinophils Relative 3  0 - 5 %   Eosinophils Absolute 0.2  0.0 - 0.7 K/uL   Basophils Relative 1  0 - 1 %   Basophils Absolute 0.0  0.0 - 0.1 K/uL   Smear Review Criteria for review not met    BASIC METABOLIC PANEL     Status: None   Collection Time    05/07/13 12:03 PM      Result Value Range   Sodium 139  135 - 145 mEq/L   Potassium 4.6  3.5 - 5.3 mEq/L   Chloride 103  96 - 112 mEq/L   CO2 29  19 - 32 mEq/L   Glucose, Bld 82  70 - 99 mg/dL   BUN 15  6 - 23 mg/dL   Creat 0.86  5.78 - 4.69 mg/dL   Calcium 9.3  8.4 - 62.9 mg/dL  HEPATIC FUNCTION PANEL     Status: None   Collection Time    05/07/13 12:03 PM      Result Value Range   Total Bilirubin 0.5  0.3 - 1.2 mg/dL   Bilirubin, Direct 0.1  0.0 - 0.3 mg/dL   Indirect Bilirubin 0.4  0.0 - 0.9 mg/dL   Alkaline Phosphatase 57  39 - 117 U/L   AST 26  0 - 37 U/L   ALT 38  0 - 53 U/L   Total Protein 6.8  6.0 - 8.3 g/dL   Albumin 4.4  3.5 - 5.2 g/dL  TSH     Status: None   Collection Time    05/07/13 12:03 PM      Result Value Range   TSH 2.344  0.350 - 4.500 uIU/mL  URINALYSIS, ROUTINE W REFLEX MICROSCOPIC     Status: None   Collection Time    05/07/13 12:03 PM      Result Value Range   Color, Urine YELLOW  YELLOW   APPearance CLEAR  CLEAR   Specific Gravity, Urine 1.023  1.005 - 1.030   pH 5.5  5.0 - 8.0   Glucose, UA NEG  NEG mg/dL   Bilirubin Urine NEG  NEG   Ketones, ur NEG  NEG mg/dL   Hgb urine dipstick NEG  NEG   Protein, ur NEG  NEG mg/dL   Urobilinogen, UA 0.2  0.0 - 1.0 mg/dL   Nitrite NEG  NEG   Leukocytes, UA NEG  NEG  HEMOGLOBIN A1C     Status: None   Collection Time    05/07/13 12:03 PM      Result Value Range   Hemoglobin A1C 5.4  <5.7 %   Comment:                                                                             According to the ADA Clinical Practice Recommendations for 2011, when     HbA1c is used as a screening test:             >=6.5%   Diagnostic of Diabetes Mellitus                (if abnormal result is confirmed)           5.7-6.4%   Increased risk of developing Diabetes Mellitus           References:Diagnosis and Classification of Diabetes Mellitus,Diabetes     Care,2011,34(Suppl 1):S62-S69 and Standards of Medical Care in             Diabetes - 2011,Diabetes Care,2011,34 (Suppl 1):S11-S61.         Mean Plasma Glucose 108  <117 mg/dL  LIPID PANEL     Status: Abnormal   Collection Time    05/07/13 12:03 PM      Result Value Range   Cholesterol 171  0 - 200 mg/dL   Comment: ATP III Classification:           < 200        mg/dL        Desirable          200 - 239     mg/dL        Borderline High          >= 240        mg/dL        High         Triglycerides 216 (*) <150 mg/dL   HDL 37 (*) >40 mg/dL   Total CHOL/HDL Ratio 4.6     VLDL 43 (*) 0 - 40 mg/dL   LDL Cholesterol 91  0 - 99 mg/dL   Comment:       Total Cholesterol/HDL Ratio:CHD Risk                            Coronary Heart Disease Risk Table                                            Men       Women              1/2 Average Risk              3.4        3.3  Average Risk              5.0        4.4               2X Average Risk              9.6        7.1               3X Average Risk             23.4       11.0     Use the calculated Patient Ratio above and the CHD Risk table      to determine the patient's CHD Risk.     ATP III Classification (LDL):           < 100        mg/dL         Optimal          100 - 129     mg/dL         Near or Above Optimal          130 - 159     mg/dL         Borderline High          160 - 189     mg/dL         High           > 190        mg/dL         Very High          Assessment/Plan: Sinusitis Possibly viral.  However, given AOM will Rx Augmentin.  Increase fluid  intake.  Rest.  Saline nasal spray.  OTC antihistamine.  Probiotic.  Albuterol if needed for wheeze.  Humidifier in bedroom.  Return to clinic if symptoms not improving.  Acute otitis media Rx Augmentin given concomitant sinus symptoms.  Muscle strain Ibuprofen.  Topical Salon Pas or Aspercreme.  Rest.  Avoid overuse.  Warm compresses.  Rx Flexeril -- 1/2 tablet during day and 1 tablet at bedtime as needed.

## 2013-07-04 NOTE — Assessment & Plan Note (Signed)
Rx Augmentin given concomitant sinus symptoms.

## 2013-07-04 NOTE — Assessment & Plan Note (Signed)
Ibuprofen.  Topical Salon Pas or Aspercreme.  Rest.  Avoid overuse.  Warm compresses.  Rx Flexeril -- 1/2 tablet during day and 1 tablet at bedtime as needed.

## 2013-07-04 NOTE — Assessment & Plan Note (Signed)
Possibly viral.  However, given AOM will Rx Augmentin.  Increase fluid intake.  Rest.  Saline nasal spray.  OTC antihistamine.  Probiotic.  Albuterol if needed for wheeze.  Humidifier in bedroom.  Return to clinic if symptoms not improving.

## 2013-07-10 ENCOUNTER — Other Ambulatory Visit: Payer: Self-pay

## 2013-09-08 ENCOUNTER — Telehealth: Payer: Self-pay | Admitting: Physician Assistant

## 2013-09-08 NOTE — Telephone Encounter (Signed)
Patient states that his children have been diagnosed with strep throat and now he has a sore throat. He would like to know if he needs to be seen or if we can send him in something to the pharmacy? [facility] [address]

## 2013-09-08 NOTE — Telephone Encounter (Signed)
Patient states that he will callback to schedule appointment

## 2013-09-08 NOTE — Telephone Encounter (Signed)
Please Advise patient that he needs appt to be assessed & evaluated before antibiotics can be prescribed/SLS Thanks.

## 2013-11-04 ENCOUNTER — Other Ambulatory Visit: Payer: Self-pay | Admitting: Physician Assistant

## 2013-11-04 NOTE — Telephone Encounter (Signed)
Informed patient of medication refill and he scheduled appointment for 11/19/13 °

## 2013-11-04 NOTE — Telephone Encounter (Signed)
30 day supply of sertraline sent to pharmacy.  Pt is due for follow up of his depression this month.  Please call pt to arrange appt.

## 2013-11-19 ENCOUNTER — Ambulatory Visit (INDEPENDENT_AMBULATORY_CARE_PROVIDER_SITE_OTHER): Payer: 59 | Admitting: Physician Assistant

## 2013-11-19 ENCOUNTER — Encounter: Payer: Self-pay | Admitting: Physician Assistant

## 2013-11-19 VITALS — BP 118/86 | HR 72 | Temp 97.9°F | Resp 16 | Ht 69.0 in | Wt 205.2 lb

## 2013-11-19 DIAGNOSIS — G43909 Migraine, unspecified, not intractable, without status migrainosus: Secondary | ICD-10-CM

## 2013-11-19 DIAGNOSIS — F32A Depression, unspecified: Secondary | ICD-10-CM

## 2013-11-19 DIAGNOSIS — F329 Major depressive disorder, single episode, unspecified: Secondary | ICD-10-CM

## 2013-11-19 DIAGNOSIS — F3289 Other specified depressive episodes: Secondary | ICD-10-CM

## 2013-11-19 MED ORDER — SERTRALINE HCL 50 MG PO TABS: ORAL_TABLET | ORAL | Status: DC

## 2013-11-19 MED ORDER — TIMOLOL MALEATE 10 MG PO TABS: 10.0000 mg | ORAL_TABLET | Freq: Two times a day (BID) | ORAL | Status: DC

## 2013-11-19 NOTE — Patient Instructions (Signed)
Please continue Sertraline (Zoloft) daily.  Begin Timolol twice daily to prevent migraine headache.  Follow-up in 2 weeks for a BP recheck with the nurse.  Follow-up with me in 1 month.  Continue Excedrin as needed if a migraine develops.  Call or return to clinic sooner, if you need us.  Migraine Headache A migraine headache is an intense, throbbing pain on one or both sides of your head. A migraine can last for 30 minutes to several hours. CAUSES  The exact cause of a migraine headache is not always known. However, a migraine may be caused when nerves in the brain become irritated and release chemicals that cause inflammation. This causes pain. Certain things may also trigger migraines, such as:  Alcohol.  Smoking.  Stress.  Menstruation.  Aged cheeses.  Foods or drinks that contain nitrates, glutamate, aspartame, or tyramine.  Lack of sleep.  Chocolate.  Caffeine.  Hunger.  Physical exertion.  Fatigue.  Medicines used to treat chest pain (nitroglycerine), birth control pills, estrogen, and some blood pressure medicines. SIGNS AND SYMPTOMS  Pain on one or both sides of your head.  Pulsating or throbbing pain.  Severe pain that prevents daily activities.  Pain that is aggravated by any physical activity.  Nausea, vomiting, or both.  Dizziness.  Pain with exposure to bright lights, loud noises, or activity.  General sensitivity to bright lights, loud noises, or smells. Before you get a migraine, you may get warning signs that a migraine is coming (aura). An aura may include:  Seeing flashing lights.  Seeing bright spots, halos, or zig-zag lines.  Having tunnel vision or blurred vision.  Having feelings of numbness or tingling.  Having trouble talking.  Having muscle weakness. DIAGNOSIS  A migraine headache is often diagnosed based on:  Symptoms.  Physical exam.  A CT scan or MRI of your head. These imaging tests cannot diagnose migraines, but they  can help rule out other causes of headaches. TREATMENT Medicines may be given for pain and nausea. Medicines can also be given to help prevent recurrent migraines.  HOME CARE INSTRUCTIONS  Only take over-the-counter or prescription medicines for pain or discomfort as directed by your health care provider. The use of long-term narcotics is not recommended.  Lie down in a dark, quiet room when you have a migraine.  Keep a journal to find out what may trigger your migraine headaches. For example, write down:  What you eat and drink.  How much sleep you get.  Any change to your diet or medicines.  Limit alcohol consumption.  Quit smoking if you smoke.  Get 7 9 hours of sleep, or as recommended by your health care provider.  Limit stress.  Keep lights dim if bright lights bother you and make your migraines worse. SEEK IMMEDIATE MEDICAL CARE IF:   Your migraine becomes severe.  You have a fever.  You have a stiff neck.  You have vision loss.  You have muscular weakness or loss of muscle control.  You start losing your balance or have trouble walking.  You feel faint or pass out.  You have severe symptoms that are different from your first symptoms. MAKE SURE YOU:   Understand these instructions.  Will watch your condition.  Will get help right away if you are not doing well or get worse. Document Released: 08/21/2005 Document Revised: 06/11/2013 Document Reviewed: 04/28/2013 [facility] Patient Information 2014 [place], [place].

## 2013-11-19 NOTE — Progress Notes (Signed)
Pre visit review using our clinic review tool, if applicable. No additional management support is needed unless otherwise documented below in the visit note/SLS  

## 2013-11-19 NOTE — Assessment & Plan Note (Signed)
Frequent.  Did not do well previously with Topamax.  Will Rx Timolol 10 mg BID.  Continue excedrine migraine if needed for abortive therapy.  No caffeine.  Increase aerobic exercise.  Follow-up in 2 weeks for a BP recheck with nurse.  Follow-up in 1 month for symptom reassessment.

## 2013-11-19 NOTE — Progress Notes (Signed)
Patient presents to clinic today for follow-up of anxiety/depression.  Patient continues Zoloft 50 mg daily for anxiety/depression.  Well-controlled.  Denies depressed mood, anhedonia, SI/HI.    Patient reports increased frequency of migraine headaches -- occur 2-3x per week over the past 3 months. Endorses nausea, photophobia and phonophobia with migraine.  Denies aura. No change to diet.  No change in stress levels.  Sedentary lifetsyle.  Endorses little-to-no caffeine intake.  Has been placed on Topamax in the past with little relief of symptoms.    Past Medical History  Diagnosis Date  . Depression     no counseling  . Head ache     Current Outpatient Prescriptions on File Prior to Visit  Medication Sig Dispense Refill  . albuterol (PROVENTIL HFA;VENTOLIN HFA) 108 (90 BASE) MCG/ACT inhaler Inhale 2 puffs into the lungs every 6 (six) hours as needed for wheezing.  1 Inhaler  0  . amoxicillin-clavulanate (AUGMENTIN) 875-125 MG per tablet Take 1 tablet by mouth 2 (two) times daily.  20 tablet  0  . Aspirin-Acetaminophen-Caffeine (EXCEDRIN MIGRAINE PO) Take by mouth.      . cyclobenzaprine (FLEXERIL) 10 MG tablet Take 1 tablet (10 mg total) by mouth 3 (three) times daily as needed for muscle spasms.  30 tablet  0  . fluticasone (FLONASE) 50 MCG/ACT nasal spray Place 2 sprays into the nose daily as needed for rhinitis.  16 g  3  . ibuprofen (ADVIL,MOTRIN) 200 MG tablet Take 200 mg by mouth every 6 (six) hours as needed for pain.      Marland Kitchen sertraline (ZOLOFT) 50 MG tablet TAKE 1 TABLET BY MOUTH EVERY DAY  30 tablet  0   No current facility-administered medications on file prior to visit.    No Known Allergies  Family History  Problem Relation Age of Onset  . Alcohol abuse      maternal & paternal grandfather, & sister  . Heart disease Father     stent & bypass  . Prostate cancer Paternal Uncle   . Stroke Mother 22  . Hypertension Neg Hx   . Hypothyroidism Mother   . Diabetes Neg Hx    . Colon cancer Neg Hx   . Multiple sclerosis Brother     History   Social History  . Marital Status: Married    Spouse Name: N/A    Number of Children: N/A  . Years of Education: N/A   Social History Main Topics  . Smoking status: Never Smoker   . Smokeless tobacco: Never Used  . Alcohol Use: No  . Drug Use: No  . Sexual Activity: Not on file   Other Topics Concern  . Not on file   Social History Narrative  . No narrative on file   Review of Systems - See HPI.  All other ROS are negative.  There were no vitals taken for this visit.  Physical Exam  Vitals reviewed. Constitutional: He is oriented to person, place, and time and well-developed, well-nourished, and in no distress.  HENT:  Head: Normocephalic and atraumatic.  Eyes: Conjunctivae are normal. Pupils are equal, round, and reactive to light.  Neck: Neck supple.  Cardiovascular: Normal rate, regular rhythm, normal heart sounds and intact distal pulses.   Pulmonary/Chest: Effort normal and breath sounds normal.  Neurological: He is alert and oriented to person, place, and time.  Skin: Skin is warm and dry. No rash noted.  Psychiatric: Mood, memory, affect and judgment normal.    No results found  for this or any previous visit (from the past 2160 hour(s)).  Assessment/Plan: No problem-specific assessment & plan notes found for this encounter.

## 2013-11-19 NOTE — Assessment & Plan Note (Signed)
Doing very well on Zoloft. Continue current medication regimen.  Will send in 90-day supply.

## 2014-01-27 ENCOUNTER — Encounter: Payer: Self-pay | Admitting: Internal Medicine

## 2014-01-27 ENCOUNTER — Ambulatory Visit (INDEPENDENT_AMBULATORY_CARE_PROVIDER_SITE_OTHER): Payer: 59 | Admitting: Internal Medicine

## 2014-01-27 VITALS — BP 114/73 | HR 83 | Temp 100.1°F | Wt 206.0 lb

## 2014-01-27 DIAGNOSIS — R509 Fever, unspecified: Secondary | ICD-10-CM

## 2014-01-27 DIAGNOSIS — J039 Acute tonsillitis, unspecified: Secondary | ICD-10-CM

## 2014-01-27 DIAGNOSIS — J029 Acute pharyngitis, unspecified: Secondary | ICD-10-CM

## 2014-01-27 LAB — POCT INFLUENZA A/B
Influenza A, POC: NEGATIVE
Influenza B, POC: NEGATIVE

## 2014-01-27 LAB — POCT RAPID STREP A (OFFICE): Rapid Strep A Screen: POSITIVE — AB

## 2014-01-27 MED ORDER — AMOXICILLIN 500 MG PO CAPS: 1000.0000 mg | ORAL_CAPSULE | Freq: Two times a day (BID) | ORAL | Status: DC

## 2014-01-27 NOTE — Progress Notes (Signed)
Pre visit review using our clinic review tool, if applicable. No additional management support is needed unless otherwise documented below in the visit note. 

## 2014-01-27 NOTE — Progress Notes (Signed)
Subjective:    Patient ID: Bradley Richards, male    DOB: 1966-02-20, 48 y.o.   MRN: [id number]  DOS:  01/27/2014 Type of  Visit: Acute History: Patient went hiking yesterday, he came back home and developed watery diarrhea. Today developed chills, generalized joint aches and  severe sore throat. No diarrhea today. No other family members affected.   ROS Has fever here at the office but no documented fever at home. Mild chills today. Mild nausea, no vomiting, no blood in the stools. No sinus pain or congestion. No cough or sputum production. No rash, no recent tick bites. Admits to a headache which is not unusual for him, not the worse headache of his life. When asked   reports mild neck  stiffness but denies photophobia or phonophobia.  Past Medical History  Diagnosis Date  . Depression     no counseling  . Head ache     Past Surgical History  Procedure Laterality Date  . No past surgeries  12/11/2011    History   Social History  . Marital Status: Married    Spouse Name: N/A    Number of Children: N/A  . Years of Education: N/A   Occupational History  . Not on file.   Social History Main Topics  . Smoking status: Never Smoker   . Smokeless tobacco: Never Used  . Alcohol Use: No  . Drug Use: No  . Sexual Activity: Not on file   Other Topics Concern  . Not on file   Social History Narrative  . No narrative on file        Medication List       This list is accurate as of: 01/27/14  5:42 PM.  Always use your most recent med list.               amoxicillin 500 MG capsule  Commonly known as:  AMOXIL  Take 2 capsules (1,000 mg total) by mouth 2 (two) times daily.     EXCEDRIN MIGRAINE PO  Take by mouth.     fluticasone 50 MCG/ACT nasal spray  Commonly known as:  FLONASE  Place 2 sprays into the nose daily as needed for rhinitis.     ibuprofen 200 MG tablet  Commonly known as:  ADVIL,MOTRIN  Take 200 mg by mouth every 6 (six) hours as needed for  pain.     sertraline 50 MG tablet  Commonly known as:  ZOLOFT  TAKE 1 TABLET BY MOUTH EVERY DAY           Objective:   Physical Exam BP 114/73  Pulse 83  Temp(Src) 100.1 F (37.8 C)  Wt 206 lb (93.441 kg)  SpO2 99% General -- alert, well-developed, NAD. No toxic appearing Neck --few B  LADs, no stiffness on exam HEENT-- Not pale. TMs normal, throat symmetric uvula midline, tonsils enlarged B, ++ redness and white d/c Face symmetric, sinuses not tender to palpation. Nose not congested. Lungs -- normal respiratory effort, no intercostal retractions, no accessory muscle use, and normal breath sounds.  Heart-- normal rate, regular rhythm, no murmur.  Extremities-- no pretibial edema bilaterally Neurologic--  alert & oriented X3. Speech normal, gait normal, strength normal in all extremities.  Skin-- no rash at exposed skin Psych-- Cognition and judgment appear intact. Cooperative with normal attention span and concentration. No anxious or depressed appearing.      Assessment & Plan:  Tonsillitis Patient presents with chills, generalized aches and sore throat.  Strep test was positive. Not clear why he had diarrhea but is probably related to the strep infection. He did report mild neck stiffness, on exam he is nontoxic and there is no obvious neck abnormality . Plan: Treat with amoxicillin, rest, definitely call if he starts to feel worse, has severe headache, severe stiffness in the neck, see instructions

## 2014-01-27 NOTE — Patient Instructions (Signed)
Rest, drink plenty of fluids. Take the antibiotics for 10 days For pain or fever : take Motrin or Tylenol, see instructions below  Motrin 200 mg 2 tablets every 6 hours as needed for pain. Always take it with food because may cause gastritis and ulcers. If you notice nausea, stomach pain, change in the color of stools --->  Stop the medicine and let us know  Tylenol  500 mg OTC 2 tabs a day every 8 hours as needed for pain  If you have worse, you have high fever, rash, severe headache or a stiff neck: Call us immediately or go to the ER      Tonsillitis Tonsillitis is an infection of the throat that causes the tonsils to become red, tender, and swollen. Tonsils are collections of lymphoid tissue at the back of the throat. Each tonsil has crevices (crypts). Tonsils help fight nose and throat infections and keep infection from spreading to other parts of the body for the first 18 months of life.  CAUSES Sudden (acute) tonsillitis is usually caused by infection with streptococcal bacteria. Long-lasting (chronic) tonsillitis occurs when the crypts of the tonsils become filled with pieces of food and bacteria, which makes it easy for the tonsils to become repeatedly infected. SYMPTOMS  Symptoms of tonsillitis include:  A sore throat, with possible difficulty swallowing.  White patches on the tonsils.  Fever.  Tiredness.  New episodes of snoring during sleep, when you did not snore before.  Small, foul-smelling, yellowish-white pieces of material (tonsilloliths) that you occasionally cough up or spit out. The tonsilloliths can also cause you to have bad breath. DIAGNOSIS Tonsillitis can be diagnosed through a physical exam. Diagnosis can be confirmed with the results of lab tests, including a throat culture. TREATMENT  The goals of tonsillitis treatment include the reduction of the severity and duration of symptoms and prevention of associated conditions. Symptoms of tonsillitis can be  improved with the use of steroids to reduce the swelling. Tonsillitis caused by bacteria can be treated with antibiotics. Usually, treatment with antibiotics is started before the cause of the tonsillitis is known. However, if it is determined that the cause is not bacterial, antibiotics will not treat the tonsillitis. If attacks of tonsillitis are severe and frequent, your caregiver may recommend surgery to remove the tonsils (tonsillectomy). HOME CARE INSTRUCTIONS   Rest as much as possible and get plenty of sleep.  Drink plenty of fluids. While the throat is very sore, eat soft foods or liquids, such as sherbet, soups, or instant breakfast drinks.  Eat frozen ice pops.  Gargle with a warm or cold liquid to help soothe the throat. Mix 1/4 teaspoon of salt and 1/4 teaspoon of baking soda in in 8 oz of water. SEEK MEDICAL CARE IF:   Large, tender lumps develop in your neck.  A rash develops.  A green, yellow-brown, or bloody substance is coughed up.  You are unable to swallow liquids or food for 24 hours.  You notice that only one of the tonsils is swollen. SEEK IMMEDIATE MEDICAL CARE IF:   You develop any new symptoms such as vomiting, severe headache, stiff neck, chest pain, or trouble breathing or swallowing.  You have severe throat pain along with drooling or voice changes.  You have severe pain, unrelieved with recommended medications.  You are unable to fully open the mouth.  You develop redness, swelling, or severe pain anywhere in the neck.  You have a fever. MAKE SURE YOU:  Understand these instructions.  Will watch your condition.  Will get help right away if you are not doing well or get worse. Document Released: 05/31/2005 Document Revised: 04/23/2013 Document Reviewed: 02/07/2013 [facility] Patient Information 2014 [place], [place].

## 2014-01-29 ENCOUNTER — Telehealth: Payer: Self-pay | Admitting: Internal Medicine

## 2014-01-29 NOTE — Telephone Encounter (Signed)
Spoke with pt and advised that ongoing swelling was normal at this point and to continue abx. Advised to continue with sx mgmt.

## 2014-01-29 NOTE — Telephone Encounter (Signed)
Pt called back. Please return call.  

## 2014-01-29 NOTE — Telephone Encounter (Signed)
Caller name: Jasiah  Call back [phone]   Reason for call:  Pt was seen on 5/26 for strep throat; pt states that his glades are still swollen and wants to know if this is normal since he has been taking the antibiotics.  Pt also wants to know if he is still contagious.  Please contact to advise.

## 2014-01-29 NOTE — Telephone Encounter (Signed)
Called pt back, left message on voice mail for the patient to return my call.

## 2014-05-19 ENCOUNTER — Ambulatory Visit (INDEPENDENT_AMBULATORY_CARE_PROVIDER_SITE_OTHER): Payer: 59 | Admitting: Physician Assistant

## 2014-05-19 ENCOUNTER — Encounter: Payer: Self-pay | Admitting: Physician Assistant

## 2014-05-19 ENCOUNTER — Encounter: Payer: 59 | Admitting: Physician Assistant

## 2014-05-19 VITALS — BP 98/76 | HR 66 | Temp 98.3°F | Resp 16 | Ht 69.0 in | Wt 204.5 lb

## 2014-05-19 DIAGNOSIS — F3289 Other specified depressive episodes: Secondary | ICD-10-CM

## 2014-05-19 DIAGNOSIS — F329 Major depressive disorder, single episode, unspecified: Secondary | ICD-10-CM

## 2014-05-19 DIAGNOSIS — F32A Depression, unspecified: Secondary | ICD-10-CM

## 2014-05-19 DIAGNOSIS — Z23 Encounter for immunization: Secondary | ICD-10-CM

## 2014-05-19 DIAGNOSIS — G43709 Chronic migraine without aura, not intractable, without status migrainosus: Secondary | ICD-10-CM

## 2014-05-19 DIAGNOSIS — Z0000 Encounter for general adult medical examination without abnormal findings: Secondary | ICD-10-CM

## 2014-05-19 LAB — URINALYSIS, ROUTINE W REFLEX MICROSCOPIC
BILIRUBIN URINE: NEGATIVE
HGB URINE DIPSTICK: NEGATIVE
Ketones, ur: NEGATIVE
Leukocytes, UA: NEGATIVE
NITRITE: NEGATIVE
Specific Gravity, Urine: 1.02 (ref 1.000–1.030)
Total Protein, Urine: NEGATIVE
Urine Glucose: NEGATIVE
Urobilinogen, UA: 0.2 (ref 0.0–1.0)
pH: 6 (ref 5.0–8.0)

## 2014-05-19 LAB — HEPATIC FUNCTION PANEL
ALK PHOS: 55 U/L (ref 39–117)
ALT: 42 U/L (ref 0–53)
AST: 26 U/L (ref 0–37)
Albumin: 4.2 g/dL (ref 3.5–5.2)
Bilirubin, Direct: 0.1 mg/dL (ref 0.0–0.3)
Total Bilirubin: 0.6 mg/dL (ref 0.2–1.2)
Total Protein: 7 g/dL (ref 6.0–8.3)

## 2014-05-19 LAB — BASIC METABOLIC PANEL
BUN: 16 mg/dL (ref 6–23)
CALCIUM: 9.3 mg/dL (ref 8.4–10.5)
CO2: 28 mEq/L (ref 19–32)
Chloride: 102 mEq/L (ref 96–112)
Creatinine, Ser: 1.1 mg/dL (ref 0.4–1.5)
GFR: 79.94 mL/min (ref >60.00)
Glucose, Bld: 86 mg/dL (ref 70–99)
POTASSIUM: 4.2 meq/L (ref 3.5–5.1)
SODIUM: 137 meq/L (ref 135–145)

## 2014-05-19 LAB — LIPID PANEL
CHOL/HDL RATIO: 6
Cholesterol: 182 mg/dL (ref 0–200)
HDL: 32.8 mg/dL — ABNORMAL LOW (ref >39.00)
NONHDL: 149.2
Triglycerides: 234 mg/dL — ABNORMAL HIGH (ref 0.0–149.0)
VLDL: 46.8 mg/dL — ABNORMAL HIGH (ref 0.0–40.0)

## 2014-05-19 LAB — LDL CHOLESTEROL, DIRECT: Direct LDL: 120.9 mg/dL

## 2014-05-19 LAB — TSH: TSH: 2.78 u[IU]/mL (ref 0.35–4.50)

## 2014-05-19 LAB — HEMOGLOBIN A1C: Hgb A1c MFr Bld: 5.5 % (ref 4.6–6.5)

## 2014-05-19 MED ORDER — SUMATRIPTAN SUCCINATE 50 MG PO TABS: 50.0000 mg | ORAL_TABLET | Freq: Once | ORAL | Status: DC

## 2014-05-19 MED ORDER — VENLAFAXINE HCL ER 37.5 MG PO CP24: 37.5000 mg | ORAL_CAPSULE | Freq: Every day | ORAL | Status: DC

## 2014-05-19 NOTE — Assessment & Plan Note (Signed)
Daily now.  Patient to see his Optometrist for eye examination. Patient previously on Topamax without improvement.  BP and pulse at low end of normal so will not begin with BB or CCB therapy. Will stop Sertraline and Rx 37.5 mg XR Venlafaxine for depression and migraine prophylaxis. Follow-up in 1 month.

## 2014-05-19 NOTE — Progress Notes (Signed)
Pre visit review using our clinic review tool, if applicable. No additional management support is needed unless otherwise documented below in the visit note/SLS  

## 2014-05-19 NOTE — Patient Instructions (Signed)
Please stop by the lab for blood work. I will call you with your results.  STOP the Sertraline (Zoloft).  Begin taking Venlafaxine XR daily as directed.  This will help to prevent migraines.  Use Imitrex as directed for severe migraine.  Continue to eat a well balanced diet and stay hydrated.  Follow-up with me in 1 month.  Preventive Care for Adults A healthy lifestyle and preventive care can promote health and wellness. Preventive health guidelines for men include the following key practices:  A routine yearly physical is a good way to check with your health care provider about your health and preventative screening. It is a chance to share any concerns and updates on your health and to receive a thorough exam.  Visit your dentist for a routine exam and preventative care every 6 months. Brush your teeth twice a day and floss once a day. Good oral hygiene prevents tooth decay and gum disease.  The frequency of eye exams is based on your age, health, family medical history, use of contact lenses, and other factors. Follow your health care provider's recommendations for frequency of eye exams.  Eat a healthy diet. Foods such as vegetables, fruits, whole grains, low-fat dairy products, and lean protein foods contain the nutrients you need without too many calories. Decrease your intake of foods high in solid fats, added sugars, and salt. Eat the right amount of calories for you.Get information about a proper diet from your health care provider, if necessary.  Regular physical exercise is one of the most important things you can do for your health. Most adults should get at least 150 minutes of moderate-intensity exercise (any activity that increases your heart rate and causes you to sweat) each week. In addition, most adults need muscle-strengthening exercises on 2 or more days a week.  Maintain a healthy weight. The body mass index (BMI) is a screening tool to identify possible weight problems. It  provides an estimate of body fat based on height and weight. Your health care provider can find your BMI and can help you achieve or maintain a healthy weight.For adults 20 years and older:  A BMI below 18.5 is considered underweight.  A BMI of 18.5 to 24.9 is normal.  A BMI of 25 to 29.9 is considered overweight.  A BMI of 30 and above is considered obese.  Maintain normal blood lipids and cholesterol levels by exercising and minimizing your intake of saturated fat. Eat a balanced diet with plenty of fruit and vegetables. Blood tests for lipids and cholesterol should begin at age 40 and be repeated every 5 years. If your lipid or cholesterol levels are high, you are over 50, or you are at high risk for heart disease, you may need your cholesterol levels checked more frequently.Ongoing high lipid and cholesterol levels should be treated with medicines if diet and exercise are not working.  If you smoke, find out from your health care provider how to quit. If you do not use tobacco, do not start.  Lung cancer screening is recommended for adults aged 68-80 years who are at high risk for developing lung cancer because of a history of smoking. A yearly low-dose CT scan of the lungs is recommended for people who have at least a 30-pack-year history of smoking and are a current smoker or have quit within the past 15 years. A pack year of smoking is smoking an average of 1 pack of cigarettes a day for 1 year (for example: 1  pack a day for 30 years or 2 packs a day for 15 years). Yearly screening should continue until the smoker has stopped smoking for at least 15 years. Yearly screening should be stopped for people who develop a health problem that would prevent them from having lung cancer treatment.  If you choose to drink alcohol, do not have more than 2 drinks per day. One drink is considered to be 12 ounces (355 mL) of beer, 5 ounces (148 mL) of wine, or 1.5 ounces (44 mL) of liquor.  Avoid use of  street drugs. Do not share needles with anyone. Ask for help if you need support or instructions about stopping the use of drugs.  High blood pressure causes heart disease and increases the risk of stroke. Your blood pressure should be checked at least every 1-2 years. Ongoing high blood pressure should be treated with medicines, if weight loss and exercise are not effective.  If you are 85-3 years old, ask your health care provider if you should take aspirin to prevent heart disease.  Diabetes screening involves taking a blood sample to check your fasting blood sugar level. This should be done once every 3 years, after age 64, if you are within normal weight and without risk factors for diabetes. Testing should be considered at a younger age or be carried out more frequently if you are overweight and have at least 1 risk factor for diabetes.  Colorectal cancer can be detected and often prevented. Most routine colorectal cancer screening begins at the age of 78 and continues through age 8. However, your health care provider may recommend screening at an earlier age if you have risk factors for colon cancer. On a yearly basis, your health care provider may provide home test kits to check for hidden blood in the stool. Use of a small camera at the end of a tube to directly examine the colon (sigmoidoscopy or colonoscopy) can detect the earliest forms of colorectal cancer. Talk to your health care provider about this at age 91, when routine screening begins. Direct exam of the colon should be repeated every 5-10 years through age 28, unless early forms of precancerous polyps or small growths are found.  People who are at an increased risk for hepatitis B should be screened for this virus. You are considered at high risk for hepatitis B if:  You were born in a country where hepatitis B occurs often. Talk with your health care provider about which countries are considered high risk.  Your parents were  born in a high-risk country and you have not received a shot to protect against hepatitis B (hepatitis B vaccine).  You have HIV or AIDS.  You use needles to inject street drugs.  You live with, or have sex with, someone who has hepatitis B.  You are a man who has sex with other men (MSM).  You get hemodialysis treatment.  You take certain medicines for conditions such as cancer, organ transplantation, and autoimmune conditions.  Hepatitis C blood testing is recommended for all people born from [birth year] through 1965 and any individual with known risks for hepatitis C.  Practice safe sex. Use condoms and avoid high-risk sexual practices to reduce the spread of sexually transmitted infections (STIs). STIs include gonorrhea, chlamydia, syphilis, trichomonas, herpes, HPV, and human immunodeficiency virus (HIV). Herpes, HIV, and HPV are viral illnesses that have no cure. They can result in disability, cancer, and death.  If you are at risk of being infected  with HIV, it is recommended that you take a prescription medicine daily to prevent HIV infection. This is called preexposure prophylaxis (PrEP). You are considered at risk if:  You are a man who has sex with other men (MSM) and have other risk factors.  You are a heterosexual man, are sexually active, and are at increased risk for HIV infection.  You take drugs by injection.  You are sexually active with a partner who has HIV.  Talk with your health care provider about whether you are at high risk of being infected with HIV. If you choose to begin PrEP, you should first be tested for HIV. You should then be tested every 3 months for as long as you are taking PrEP.  A one-time screening for abdominal aortic aneurysm (AAA) and surgical repair of large AAAs by ultrasound are recommended for men ages 69 to 56 years who are current or former smokers.  Healthy men should no longer receive prostate-specific antigen (PSA) blood tests as part of  routine cancer screening. Talk with your health care provider about prostate cancer screening.  Testicular cancer screening is not recommended for adult males who have no symptoms. Screening includes self-exam, a health care provider exam, and other screening tests. Consult with your health care provider about any symptoms you have or any concerns you have about testicular cancer.  Use sunscreen. Apply sunscreen liberally and repeatedly throughout the day. You should seek shade when your shadow is shorter than you. Protect yourself by wearing long sleeves, pants, a wide-brimmed hat, and sunglasses year round, whenever you are outdoors.  Once a month, do a whole-body skin exam, using a mirror to look at the skin on your back. Tell your health care provider about new moles, moles that have irregular borders, moles that are larger than a pencil eraser, or moles that have changed in shape or color.  Stay current with required vaccines (immunizations).  Influenza vaccine. All adults should be immunized every year.  Tetanus, diphtheria, and acellular pertussis (Td, Tdap) vaccine. An adult who has not previously received Tdap or who does not know his vaccine status should receive 1 dose of Tdap. This initial dose should be followed by tetanus and diphtheria toxoids (Td) booster doses every 10 years. Adults with an unknown or incomplete history of completing a 3-dose immunization series with Td-containing vaccines should begin or complete a primary immunization series including a Tdap dose. Adults should receive a Td booster every 10 years.  Varicella vaccine. An adult without evidence of immunity to varicella should receive 2 doses or a second dose if he has previously received 1 dose.  Human papillomavirus (HPV) vaccine. Males aged 64-21 years who have not received the vaccine previously should receive the 3-dose series. Males aged 22-26 years may be immunized. Immunization is recommended through the age  of 54 years for any male who has sex with males and did not get any or all doses earlier. Immunization is recommended for any person with an immunocompromised condition through the age of 22 years if he did not get any or all doses earlier. During the 3-dose series, the second dose should be obtained 4-8 weeks after the first dose. The third dose should be obtained 24 weeks after the first dose and 16 weeks after the second dose.  Zoster vaccine. One dose is recommended for adults aged 33 years or older unless certain conditions are present.  Measles, mumps, and rubella (MMR) vaccine. Adults born before [birth year] generally are considered  immune to measles and mumps. Adults born in [birth year] or later should have 1 or more doses of MMR vaccine unless there is a contraindication to the vaccine or there is laboratory evidence of immunity to each of the three diseases. A routine second dose of MMR vaccine should be obtained at least 28 days after the first dose for students attending postsecondary schools, health care workers, or international travelers. People who received inactivated measles vaccine or an unknown type of measles vaccine during 1963-1967 should receive 2 doses of MMR vaccine. People who received inactivated mumps vaccine or an unknown type of mumps vaccine before 1979 and are at high risk for mumps infection should consider immunization with 2 doses of MMR vaccine. Unvaccinated health care workers born before [birth year] who lack laboratory evidence of measles, mumps, or rubella immunity or laboratory confirmation of disease should consider measles and mumps immunization with 2 doses of MMR vaccine or rubella immunization with 1 dose of MMR vaccine.  Pneumococcal 13-valent conjugate (PCV13) vaccine. When indicated, a person who is uncertain of his immunization history and has no record of immunization should receive the PCV13 vaccine. An adult aged 45 years or older who has certain medical conditions and has not  been previously immunized should receive 1 dose of PCV13 vaccine. This PCV13 should be followed with a dose of pneumococcal polysaccharide (PPSV23) vaccine. The PPSV23 vaccine dose should be obtained at least 8 weeks after the dose of PCV13 vaccine. An adult aged 38 years or older who has certain medical conditions and previously received 1 or more doses of PPSV23 vaccine should receive 1 dose of PCV13. The PCV13 vaccine dose should be obtained 1 or more years after the last PPSV23 vaccine dose.  Pneumococcal polysaccharide (PPSV23) vaccine. When PCV13 is also indicated, PCV13 should be obtained first. All adults aged 7 years and older should be immunized. An adult younger than age 34 years who has certain medical conditions should be immunized. Any person who resides in a nursing home or long-term care facility should be immunized. An adult smoker should be immunized. People with an immunocompromised condition and certain other conditions should receive both PCV13 and PPSV23 vaccines. People with human immunodeficiency virus (HIV) infection should be immunized as soon as possible after diagnosis. Immunization during chemotherapy or radiation therapy should be avoided. Routine use of PPSV23 vaccine is not recommended for American Indians, [place] Natives, or people younger than 65 years unless there are medical conditions that require PPSV23 vaccine. When indicated, people who have unknown immunization and have no record of immunization should receive PPSV23 vaccine. One-time revaccination 5 years after the first dose of PPSV23 is recommended for people aged 19-64 years who have chronic kidney failure, nephrotic syndrome, asplenia, or immunocompromised conditions. People who received 1-2 doses of PPSV23 before age 6 years should receive another dose of PPSV23 vaccine at age 71 years or later if at least 5 years have passed since the previous dose. Doses of PPSV23 are not needed for people immunized with PPSV23 at  or after age 66 years.  Meningococcal vaccine. Adults with asplenia or persistent complement component deficiencies should receive 2 doses of quadrivalent meningococcal conjugate (MenACWY-D) vaccine. The doses should be obtained at least 2 months apart. Microbiologists working with certain meningococcal bacteria, [place] recruits, people at risk during an outbreak, and people who travel to or live in countries with a high rate of meningitis should be immunized. A first-year college student up through age 77 years who is living in  a residence hall should receive a dose if he did not receive a dose on or after his 16th birthday. Adults who have certain high-risk conditions should receive one or more doses of vaccine.  Hepatitis A vaccine. Adults who wish to be protected from this disease, have certain high-risk conditions, work with hepatitis A-infected animals, work in hepatitis A research labs, or travel to or work in countries with a high rate of hepatitis A should be immunized. Adults who were previously unvaccinated and who anticipate close contact with an international adoptee during the first 60 days after arrival in the [place] from a country with a high rate of hepatitis A should be immunized.  Hepatitis B vaccine. Adults should be immunized if they wish to be protected from this disease, have certain high-risk conditions, may be exposed to blood or other infectious body fluids, are household contacts or sex partners of hepatitis B positive people, are clients or workers in certain care facilities, or travel to or work in countries with a high rate of hepatitis B.  Haemophilus influenzae type b (Hib) vaccine. A previously unvaccinated person with asplenia or sickle cell disease or having a scheduled splenectomy should receive 1 dose of Hib vaccine. Regardless of previous immunization, a recipient of a hematopoietic stem cell transplant should receive a 3-dose series 6-12 months after his  successful transplant. Hib vaccine is not recommended for adults with HIV infection. Preventive Service / Frequency Ages 38 to 16  Blood pressure check.** / Every 1 to 2 years.  Lipid and cholesterol check.** / Every 5 years beginning at age 6.  Hepatitis C blood test.** / For any individual with known risks for hepatitis C.  Skin self-exam. / Monthly.  Influenza vaccine. / Every year.  Tetanus, diphtheria, and acellular pertussis (Tdap, Td) vaccine.** / Consult your health care provider. 1 dose of Td every 10 years.  Varicella vaccine.** / Consult your health care provider.  HPV vaccine. / 3 doses over 6 months, if 89 or younger.  Measles, mumps, rubella (MMR) vaccine.** / You need at least 1 dose of MMR if you were born in 1955 or later. You may also need a second dose.  Pneumococcal 13-valent conjugate (PCV13) vaccine.** / Consult your health care provider.  Pneumococcal polysaccharide (PPSV23) vaccine.** / 1 to 2 doses if you smoke cigarettes or if you have certain conditions.  Meningococcal vaccine.** / 1 dose if you are age 55 to 22 years and a Market researcher living in a residence hall, or have one of several medical conditions. You may also need additional booster doses.  Hepatitis A vaccine.** / Consult your health care provider.  Hepatitis B vaccine.** / Consult your health care provider.  Haemophilus influenzae type b (Hib) vaccine.** / Consult your health care provider. Ages 8 to 42  Blood pressure check.** / Every 1 to 2 years.  Lipid and cholesterol check.** / Every 5 years beginning at age 28.  Lung cancer screening. / Every year if you are aged 32-80 years and have a 30-pack-year history of smoking and currently smoke or have quit within the past 15 years. Yearly screening is stopped once you have quit smoking for at least 15 years or develop a health problem that would prevent you from having lung cancer treatment.  Fecal occult blood test (FOBT)  of stool. / Every year beginning at age 58 and continuing until age 80. You may not have to do this test if you get a colonoscopy every 10  years.  Flexible sigmoidoscopy** or colonoscopy.** / Every 5 years for a flexible sigmoidoscopy or every 10 years for a colonoscopy beginning at age 66 and continuing until age 57.  Hepatitis C blood test.** / For all people born from [birth year] through 1963 and any individual with known risks for hepatitis C.  Skin self-exam. / Monthly.  Influenza vaccine. / Every year.  Tetanus, diphtheria, and acellular pertussis (Tdap/Td) vaccine.** / Consult your health care provider. 1 dose of Td every 10 years.  Varicella vaccine.** / Consult your health care provider.  Zoster vaccine.** / 1 dose for adults aged 20 years or older.  Measles, mumps, rubella (MMR) vaccine.** / You need at least 1 dose of MMR if you were born in 1955 or later. You may also need a second dose.  Pneumococcal 13-valent conjugate (PCV13) vaccine.** / Consult your health care provider.  Pneumococcal polysaccharide (PPSV23) vaccine.** / 1 to 2 doses if you smoke cigarettes or if you have certain conditions.  Meningococcal vaccine.** / Consult your health care provider.  Hepatitis A vaccine.** / Consult your health care provider.  Hepatitis B vaccine.** / Consult your health care provider.  Haemophilus influenzae type b (Hib) vaccine.** / Consult your health care provider. Ages 40 and over  Blood pressure check.** / Every 1 to 2 years.  Lipid and cholesterol check.**/ Every 5 years beginning at age 33.  Lung cancer screening. / Every year if you are aged 3-80 years and have a 30-pack-year history of smoking and currently smoke or have quit within the past 15 years. Yearly screening is stopped once you have quit smoking for at least 15 years or develop a health problem that would prevent you from having lung cancer treatment.  Fecal occult blood test (FOBT) of stool. / Every year  beginning at age 104 and continuing until age 32. You may not have to do this test if you get a colonoscopy every 10 years.  Flexible sigmoidoscopy** or colonoscopy.** / Every 5 years for a flexible sigmoidoscopy or every 10 years for a colonoscopy beginning at age 27 and continuing until age 54.  Hepatitis C blood test.** / For all people born from [birth year] through 1963 and any individual with known risks for hepatitis C.  Abdominal aortic aneurysm (AAA) screening.** / A one-time screening for ages 55 to 29 years who are current or former smokers.  Skin self-exam. / Monthly.  Influenza vaccine. / Every year.  Tetanus, diphtheria, and acellular pertussis (Tdap/Td) vaccine.** / 1 dose of Td every 10 years.  Varicella vaccine.** / Consult your health care provider.  Zoster vaccine.** / 1 dose for adults aged 81 years or older.  Pneumococcal 13-valent conjugate (PCV13) vaccine.** / Consult your health care provider.  Pneumococcal polysaccharide (PPSV23) vaccine.** / 1 dose for all adults aged 50 years and older.  Meningococcal vaccine.** / Consult your health care provider.  Hepatitis A vaccine.** / Consult your health care provider.  Hepatitis B vaccine.** / Consult your health care provider.  Haemophilus influenzae type b (Hib) vaccine.** / Consult your health care provider. **Family history and personal history of risk and conditions may change your health care provider's recommendations. Document Released: 10/17/2001 Document Revised: 08/26/2013 Document Reviewed: 01/16/2011 [facility] Patient Information 2015 [place], [place]. This information is not intended to replace advice given to you by your health care provider. Make sure you discuss any questions you have with your health care provider.

## 2014-05-19 NOTE — Progress Notes (Signed)
Patient presents to clinic today for annual exam.  Patient is fasting for labs.  Acute Concerns: Migraines -- becoming more frequent. Daily migraine associated with phonophobia and nausea.  Denies aura, emesis or change to vision.  Excedrin migraine is helping some.  Denies change to stress or anxiety levels.  Is staying well hydrated and eating a well-balanced diet.  Chronic Issues: Depression -- previously well-controlled with Sertraline.  Denies depressed mood/anhedoinia, SI/HI.    Health Maintenance: Dental -- up-to-date Vision -- has upcoming appointment with Optometrist. Immunizations -- Tetanus up-to-date.  Flu shot given today by CMA.  Past Medical History  Diagnosis Date  . Depression     no counseling  . Head ache     Past Surgical History  Procedure Laterality Date  . No past surgeries  12/11/2011    Current Outpatient Prescriptions on File Prior to Visit  Medication Sig Dispense Refill  . Aspirin-Acetaminophen-Caffeine (EXCEDRIN MIGRAINE PO) Take by mouth as needed.       . fluticasone (FLONASE) 50 MCG/ACT nasal spray Place 2 sprays into the nose daily as needed for rhinitis.  16 g  3  . ibuprofen (ADVIL,MOTRIN) 200 MG tablet Take 200 mg by mouth every 6 (six) hours as needed for pain.       No current facility-administered medications on file prior to visit.    No Known Allergies  Family History  Problem Relation Age of Onset  . Alcohol abuse      maternal & paternal grandfather, & sister  . Heart disease Father     stent & bypass  . Prostate cancer Paternal Uncle   . Stroke Mother 14  . Hypertension Neg Hx   . Hypothyroidism Mother   . Diabetes Neg Hx   . Colon cancer Neg Hx   . Multiple sclerosis Brother     History   Social History  . Marital Status: Married    Spouse Name: N/A    Number of Children: N/A  . Years of Education: N/A   Occupational History  . Not on file.   Social History Main Topics  . Smoking status: Never Smoker   .  Smokeless tobacco: Never Used  . Alcohol Use: No  . Drug Use: No  . Sexual Activity: Not on file   Other Topics Concern  . Not on file   Social History Narrative  . No narrative on file   Review of Systems  Constitutional: Negative for fever and weight loss.  HENT: Negative for ear pain, hearing loss and tinnitus.   Eyes: Negative for blurred vision, double vision, photophobia and pain.  Respiratory: Negative for cough and sputum production.   Cardiovascular: Negative for chest pain and palpitations.  Gastrointestinal: Negative for heartburn, nausea, vomiting, abdominal pain, diarrhea, constipation, blood in stool and melena.  Genitourinary: Negative for dysuria, urgency, frequency, hematuria and flank pain.       Nocturia x 0-1.  Neurological: Positive for headaches. Negative for dizziness and loss of consciousness.  Endo/Heme/Allergies: Negative for environmental allergies.  Psychiatric/Behavioral: Negative for depression, suicidal ideas, hallucinations and substance abuse. The patient is not nervous/anxious and does not have insomnia.    BP 98/76  Pulse 66  Temp(Src) 98.3 F (36.8 C) (Oral)  Resp 16  Ht  (1.753 m)  Wt 204 lb 8 oz (92.761 kg)  BMI 30.19 kg/m2  SpO2 95%  Physical Exam  Vitals reviewed. Constitutional: He is oriented to person, place, and time and well-developed, well-nourished, and in no  distress.  HENT:  Head: Normocephalic and atraumatic.  Right Ear: External ear normal.  Left Ear: External ear normal.  Nose: Nose normal.  Mouth/Throat: Oropharynx is clear and moist. No oropharyngeal exudate.  TM within normal limits bilaterally.  Eyes: Conjunctivae are normal. Pupils are equal, round, and reactive to light.  Neck: Neck supple. No thyromegaly present.  Cardiovascular: Normal rate, regular rhythm, normal heart sounds and intact distal pulses.   Pulmonary/Chest: Effort normal. No respiratory distress. He has no wheezes. He has no rales. He  exhibits no tenderness.  Abdominal: Soft. Bowel sounds are normal. He exhibits no distension and no mass. There is no tenderness. There is no rebound and no guarding.  Lymphadenopathy:    He has no cervical adenopathy.  Neurological: He is alert and oriented to person, place, and time.  Skin: Skin is warm and dry. No rash noted.  Psychiatric: Affect normal.   Assessment/Plan: Migraines Daily now.  Patient to see his Optometrist for eye examination. Patient previously on Topamax without improvement.  BP and pulse at low end of normal so will not begin with BB or CCB therapy. Will stop Sertraline and Rx 37.5 mg XR Venlafaxine for depression and migraine prophylaxis. Follow-up in 1 month.  Depression Due to frequent migraines, will stop Sertraline and begin 37.5 mg Venlafaxine XR.  Follow-up in 1 month.

## 2014-05-19 NOTE — Assessment & Plan Note (Signed)
Due to frequent migraines, will stop Sertraline and begin 37.5 mg Venlafaxine XR.  Follow-up in 1 month.

## 2014-05-20 ENCOUNTER — Telehealth: Payer: Self-pay | Admitting: Physician Assistant

## 2014-05-20 LAB — CBC
HEMATOCRIT: 43 % (ref 39.0–52.0)
Hemoglobin: 14.6 g/dL (ref 13.0–17.0)
MCHC: 34.1 g/dL (ref 30.0–36.0)
MCV: 92.4 fl (ref 78.0–100.0)
PLATELETS: 204 10*3/uL (ref 150.0–400.0)
RBC: 4.65 Mil/uL (ref 4.22–5.81)
RDW: 13.4 % (ref 11.5–15.5)
WBC: 5.7 10*3/uL (ref 4.0–10.5)

## 2014-05-20 NOTE — Telephone Encounter (Signed)
Labs are good overall.  Triglycerides are still elevated.  I recommend that he start a daily krill oil supplement.  Limit refined sugars, carbs and alcohol.  IF he is already taking a fish oil/krill oil supplement, we will need to start a Rx medication.

## 2014-05-20 NOTE — Telephone Encounter (Signed)
Patient informed, understood & agreed/SLS  

## 2014-06-22 ENCOUNTER — Ambulatory Visit (INDEPENDENT_AMBULATORY_CARE_PROVIDER_SITE_OTHER): Payer: 59 | Admitting: Physician Assistant

## 2014-06-22 ENCOUNTER — Encounter: Payer: Self-pay | Admitting: Physician Assistant

## 2014-06-22 VITALS — BP 118/77 | HR 68 | Temp 98.4°F | Resp 16 | Ht 69.0 in | Wt 207.0 lb

## 2014-06-22 DIAGNOSIS — G43709 Chronic migraine without aura, not intractable, without status migrainosus: Secondary | ICD-10-CM

## 2014-06-22 MED ORDER — VENLAFAXINE HCL ER 75 MG PO CP24: 75.0000 mg | ORAL_CAPSULE | Freq: Every day | ORAL | Status: DC

## 2014-06-22 MED ORDER — ELETRIPTAN HYDROBROMIDE 40 MG PO TABS: 40.0000 mg | ORAL_TABLET | Freq: Once | ORAL | Status: DC

## 2014-06-22 NOTE — Progress Notes (Signed)
Pre visit review using our clinic review tool, if applicable. No additional management support is needed unless otherwise documented below in the visit note/SLS  

## 2014-06-22 NOTE — Assessment & Plan Note (Signed)
Some improvement with 36.5 mg daily Venlafaxine XR.  Will increase to 75 mg capsule daily.  Will switch Imitrex to Relpax.  Follow-up in 1-2 months.

## 2014-06-22 NOTE — Progress Notes (Signed)
Patient presents to clinic today for follow-up of migraines after initiation of Venlafaxine XR and Imitrex.  Patient also with depression symptoms that is being treated with Venlafaxine.  Patient endorses improvement in mood and decrease in frequency of migraines  Patient denies   Past Medical History  Diagnosis Date  . Depression     no counseling  . Head ache     Current Outpatient Prescriptions on File Prior to Visit  Medication Sig Dispense Refill  . Aspirin-Acetaminophen-Caffeine (EXCEDRIN MIGRAINE PO) Take by mouth as needed.       . fluticasone (FLONASE) 50 MCG/ACT nasal spray Place 2 sprays into the nose daily as needed for rhinitis.  16 g  3  . ibuprofen (ADVIL,MOTRIN) 200 MG tablet Take 200 mg by mouth every 6 (six) hours as needed for pain.       No current facility-administered medications on file prior to visit.    No Known Allergies  Family History  Problem Relation Age of Onset  . Alcohol abuse      maternal & paternal grandfather, & sister  . Heart disease Father     stent & bypass  . Prostate cancer Paternal Uncle   . Stroke Mother 57  . Hypertension Neg Hx   . Hypothyroidism Mother   . Diabetes Neg Hx   . Colon cancer Neg Hx   . Multiple sclerosis Brother     History   Social History  . Marital Status: Married    Spouse Name: N/A    Number of Children: N/A  . Years of Education: N/A   Social History Main Topics  . Smoking status: Never Smoker   . Smokeless tobacco: Never Used  . Alcohol Use: No  . Drug Use: No  . Sexual Activity: None   Other Topics Concern  . None   Social History Narrative  . None    Review of Systems - See HPI.  All other ROS are negative.  BP 118/77  Pulse 68  Temp(Src) 98.4 F (36.9 C) (Oral)  Resp 16  Ht 5\' 9"  (1.753 m)  Wt 207 lb (93.895 kg)  BMI 30.55 kg/m2  SpO2 100%  Physical Exam  Vitals reviewed. Constitutional: He is oriented to person, place, and time and well-developed, well-nourished, and in  no distress.  HENT:  Head: Normocephalic and atraumatic.  Eyes: Conjunctivae are normal.  Neck: Neck supple. No thyromegaly present.  Cardiovascular: Normal rate, regular rhythm, normal heart sounds and intact distal pulses.   Pulmonary/Chest: Effort normal and breath sounds normal. No respiratory distress. He has no wheezes. He has no rales. He exhibits no tenderness.  Neurological: He is alert and oriented to person, place, and time.  Skin: Skin is warm and dry. No rash noted.  Psychiatric: Affect normal.    Recent Results (from the past 2160 hour(s))  URINALYSIS, ROUTINE W REFLEX MICROSCOPIC     Status: None   Collection Time    05/19/14  8:56 AM      Result Value Ref Range   Color, Urine YELLOW  Yellow;Lt. Yellow   APPearance CLEAR  Clear   Specific Gravity, Urine 1.020  1.000-1.030   pH 6.0  5.0 - 8.0   Total Protein, Urine NEGATIVE  Negative   Urine Glucose NEGATIVE  Negative   Ketones, ur NEGATIVE  Negative   Bilirubin Urine NEGATIVE  Negative   Hgb urine dipstick NEGATIVE  Negative   Urobilinogen, UA 0.2  0.0 - 1.0   Leukocytes, UA  NEGATIVE  Negative   Nitrite NEGATIVE  Negative   WBC, UA 0-2/hpf  0-2/hpf   RBC / HPF 0-2/hpf  0-2/hpf   Squamous Epithelial / LPF Rare(0-4/hpf)  Rare(0-4/hpf)  CBC     Status: None   Collection Time    05/19/14  8:56 AM      Result Value Ref Range   WBC 5.7  4.0 - 10.5 K/uL   RBC 4.65  4.22 - 5.81 Mil/uL   Platelets 204.0  150.0 - 400.0 K/uL   Hemoglobin 14.6  13.0 - 17.0 g/dL   HCT 09.843.0  11.939.0 - 14.752.0 %   MCV 92.4  78.0 - 100.0 fl   MCHC 34.1  30.0 - 36.0 g/dL   RDW 82.913.4  56.211.5 - 13.015.5 %  LIPID PANEL     Status: Abnormal   Collection Time    05/19/14  8:56 AM      Result Value Ref Range   Cholesterol 182  0 - 200 mg/dL   Comment: ATP III Classification       Desirable:  < 200 mg/dL               Borderline High:  200 - 239 mg/dL          High:  > = 865240 mg/dL   Triglycerides 784.6234.0 (*) 0.0 - 149.0 mg/dL   Comment: Normal:  <962<150  mg/dLBorderline High:  150 - 199 mg/dL   HDL 95.2832.80 (*) >41.32>39.00 mg/dL   VLDL 44.046.8 (*) 0.0 - 10.240.0 mg/dL   Total CHOL/HDL Ratio 6     Comment:                Men          Women1/2 Average Risk     3.4          3.3Average Risk          5.0          4.42X Average Risk          9.6          7.13X Average Risk          15.0          11.0                       NonHDL 149.20     Comment: NOTE:  Non-HDL goal should be 30 mg/dL higher than patient's LDL goal (i.e. LDL goal of < 70 mg/dL, would have non-HDL goal of < 100 mg/dL)  HEMOGLOBIN V2ZA1C     Status: None   Collection Time    05/19/14  8:56 AM      Result Value Ref Range   Hemoglobin A1C 5.5  4.6 - 6.5 %   Comment: Glycemic Control Guidelines for People with Diabetes:Non Diabetic:  <6%Goal of Therapy: <7%Additional Action Suggested:  >8%   BASIC METABOLIC PANEL     Status: None   Collection Time    05/19/14  8:56 AM      Result Value Ref Range   Sodium 137  135 - 145 mEq/L   Potassium 4.2  3.5 - 5.1 mEq/L   Chloride 102  96 - 112 mEq/L   CO2 28  19 - 32 mEq/L   Glucose, Bld 86  70 - 99 mg/dL   BUN 16  6 - 23 mg/dL   Creatinine, Ser 1.1  0.4 - 1.5 mg/dL   Calcium 9.3  8.4 -  10.5 mg/dL   GFR 16.1079.94  >96.04>60.00 mL/min  HEPATIC FUNCTION PANEL     Status: None   Collection Time    05/19/14  8:56 AM      Result Value Ref Range   Total Bilirubin 0.6  0.2 - 1.2 mg/dL   Bilirubin, Direct 0.1  0.0 - 0.3 mg/dL   Alkaline Phosphatase 55  39 - 117 U/L   AST 26  0 - 37 U/L   ALT 42  0 - 53 U/L   Total Protein 7.0  6.0 - 8.3 g/dL   Albumin 4.2  3.5 - 5.2 g/dL  TSH     Status: None   Collection Time    05/19/14  8:56 AM      Result Value Ref Range   TSH 2.78  0.35 - 4.50 uIU/mL  LDL CHOLESTEROL, DIRECT     Status: None   Collection Time    05/19/14  8:56 AM      Result Value Ref Range   Direct LDL 120.9     Comment: Optimal:  <100 mg/dLNear or Above Optimal:  100-129 mg/dLBorderline High:  130-159 mg/dLHigh:  160-189 mg/dLVery High:  >190 mg/dL     Assessment/Plan: Migraines Some improvement with 36.5 mg daily Venlafaxine XR.  Will increase to 75 mg capsule daily.  Will switch Imitrex to Relpax.  Follow-up in 1-2 months.

## 2014-06-22 NOTE — Patient Instructions (Signed)
Please begin new dose of Effexor XR (Venlafaxine). Use Relpax for breakthrough migraine symptoms.  Follow-up in 1-2 months. Return sooner if needed. Read information below on the Effexor.  Venlafaxine extended-release capsules What is this medicine? VENLAFAXINE(VEN la fax een) is used to treat depression, anxiety and panic disorder. This medicine may be used for other purposes; ask your health care provider or pharmacist if you have questions. COMMON BRAND NAME(S): Effexor XR What should I tell my health care provider before I take this medicine? They need to know if you have any of these conditions: -bleeding disorders -glaucoma -heart disease -high blood pressure -high cholesterol -kidney disease -liver disease -low levels of sodium in the blood -mania or bipolar disorder -seizures -suicidal thoughts, plans, or attempt; a previous suicide attempt by you or a family -take medicines that treat or prevent blood clots -thyroid disease -an unusual or allergic reaction to venlafaxine, desvenlafaxine, other medicines, foods, dyes, or preservatives -pregnant or trying to get pregnant -breast-feeding How should I use this medicine? Take this medicine by mouth with a full glass of water. Follow the directions on the prescription label. Do not cut, crush, or chew this medicine. Take it with food. If needed, the capsule may be carefully opened and the entire contents sprinkled on a spoonful of cool applesauce. Swallow the applesauce/pellet mixture right away without chewing and follow with a glass of water to ensure complete swallowing of the pellets. Try to take your medicine at about the same time each day. Do not take your medicine more often than directed. Do not stop taking this medicine suddenly except upon the advice of your doctor. Stopping this medicine too quickly may cause serious side effects or your condition may worsen. A special MedGuide will be given to you by the pharmacist with  each prescription and refill. Be sure to read this information carefully each time. Talk to your pediatrician regarding the use of this medicine in children. Special care may be needed. Overdosage: If you think you have taken too much of this medicine contact a poison control center or emergency room at once. NOTE: This medicine is only for you. Do not share this medicine with others. What if I miss a dose? If you miss a dose, take it as soon as you can. If it is almost time for your next dose, take only that dose. Do not take double or extra doses. What may interact with this medicine? Do not take this medicine with any of the following medications: -certain medicines for fungal infections like fluconazole, itraconazole, ketoconazole, posaconazole, voriconazole -cisapride -desvenlafaxine -dofetilide -dronedarone -duloxetine -levomilnacipran -linezolid -MAOIs like Carbex, Eldepryl, Marplan, Nardil, and Parnate -methylene blue (injected into a vein) -milnacipran -pimozide -thioridazine -ziprasidone This medicine may also interact with the following medications: -aspirin and aspirin-like medicines -certain medicines for depression, anxiety, or psychotic disturbances -certain medicines for migraine headaches like almotriptan, eletriptan, frovatriptan, naratriptan, rizatriptan, sumatriptan, zolmitriptan -certain medicines for sleep -certain medicines that treat or prevent blood clots like dalteparin, enoxaparin, warfarin -cimetidine -clozapine -diuretics -fentanyl -furazolidone -indinavir -isoniazid -lithium -metoprolol -NSAIDS, medicines for pain and inflammation, like ibuprofen or naproxen -other medicines that prolong the QT interval (cause an abnormal heart rhythm) -procarbazine -rasagiline -supplements like St. John's wort, kava kava, valerian -tramadol -tryptophan This list may not describe all possible interactions. Give your health care provider a list of all the  medicines, herbs, non-prescription drugs, or dietary supplements you use. Also tell them if you smoke, drink alcohol, or use illegal drugs. Some  items may interact with your medicine. What should I watch for while using this medicine? Tell your doctor if your symptoms do not get better or if they get worse. Visit your doctor or health care professional for regular checks on your progress. Because it may take several weeks to see the full effects of this medicine, it is important to continue your treatment as prescribed by your doctor. Patients and their families should watch out for new or worsening thoughts of suicide or depression. Also watch out for sudden changes in feelings such as feeling anxious, agitated, panicky, irritable, hostile, aggressive, impulsive, severely restless, overly excited and hyperactive, or not being able to sleep. If this happens, especially at the beginning of treatment or after a change in dose, call your health care professional. This medicine can cause an increase in blood pressure. Check with your doctor for instructions on monitoring your blood pressure while taking this medicine. You may get drowsy or dizzy. Do not drive, use machinery, or do anything that needs mental alertness until you know how this medicine affects you. Do not stand or sit up quickly, especially if you are an older patient. This reduces the risk of dizzy or fainting spells. Alcohol may interfere with the effect of this medicine. Avoid alcoholic drinks. Your mouth may get dry. Chewing sugarless gum, sucking hard candy and drinking plenty of water will help. Contact your doctor if the problem does not go away or is severe. What side effects may I notice from receiving this medicine? Side effects that you should report to your doctor or health care professional as soon as possible: -allergic reactions like skin rash, itching or hives, swelling of the face, lips, or tongue -breathing problems -changes in  vision -hallucination, loss of contact with reality -seizures -suicidal thoughts or other mood changes -trouble passing urine or change in the amount of urine -unusual bleeding or bruising Side effects that usually do not require medical attention (report to your doctor or health care professional if they continue or are bothersome): -change in sex drive or performance -constipation -increased sweating -loss of appetite -nausea -tremors -weight loss This list may not describe all possible side effects. Call your doctor for medical advice about side effects. You may report side effects to FDA at 1-800-FDA-1088. Where should I keep my medicine? Keep out of the reach of children. Store at a controlled temperature between 20 and 25 degrees C (68 degrees and 77 degrees F), in a dry place. Throw away any unused medicine after the expiration date. NOTE: This sheet is a summary. It may not cover all possible information. If you have questions about this medicine, talk to your doctor, pharmacist, or health care provider.  2015, [employer] Standard. (2013-03-18 12:46:03)

## 2014-08-24 ENCOUNTER — Ambulatory Visit (INDEPENDENT_AMBULATORY_CARE_PROVIDER_SITE_OTHER): Payer: 59 | Admitting: Physician Assistant

## 2014-08-24 ENCOUNTER — Encounter: Payer: Self-pay | Admitting: Physician Assistant

## 2014-08-24 VITALS — BP 126/76 | HR 91 | Temp 98.5°F | Resp 16 | Ht 69.0 in | Wt 208.2 lb

## 2014-08-24 DIAGNOSIS — G43709 Chronic migraine without aura, not intractable, without status migrainosus: Secondary | ICD-10-CM

## 2014-08-24 MED ORDER — BUTALBITAL-ASPIRIN-CAFFEINE 50-325-40 MG PO CAPS: 1.0000 | ORAL_CAPSULE | Freq: Two times a day (BID) | ORAL | Status: DC | PRN

## 2014-08-24 MED ORDER — VENLAFAXINE HCL ER 150 MG PO CP24: 150.0000 mg | ORAL_CAPSULE | Freq: Every day | ORAL | Status: DC

## 2014-08-24 NOTE — Patient Instructions (Signed)
Please start new dose of the Venlafaxine XR as directed. Increase fluid intake.  Use the Firoinal as directed, if needed for severe migraine.  Call me in 2-3 weeks and update me on how the new dose is working for you.  If symptoms persistent, we may want to start another prophylactic medication.

## 2014-08-24 NOTE — Progress Notes (Signed)
   Patient presents to clinic today for follow-up of migraines.  Patient has noted good improvement with increase in Effexor XR to 75 mg daily.  Still having a few migraines per week.  Endorses when migraines occur they are more severe.  Previously unable to tolerate Imitrex or Relpax for abortive therapy. Excedrin Migraine is sub-therapuetic.  Patient denies new or worsening symptoms.  Past Medical History  Diagnosis Date  . Depression     no counseling  . Head ache     Current Outpatient Prescriptions on File Prior to Visit  Medication Sig Dispense Refill  . Aspirin-Acetaminophen-Caffeine (EXCEDRIN MIGRAINE PO) Take by mouth as needed.     . fluticasone (FLONASE) 50 MCG/ACT nasal spray Place 2 sprays into the nose daily as needed for rhinitis. 16 g 3  . ibuprofen (ADVIL,MOTRIN) 200 MG tablet Take 200 mg by mouth every 6 (six) hours as needed for pain.     No current facility-administered medications on file prior to visit.    No Known Allergies  Family History  Problem Relation Age of Onset  . Alcohol abuse      maternal & paternal grandfather, & sister  . Heart disease Father     stent & bypass  . Prostate cancer Paternal Uncle   . Stroke Mother [id number]  . Hypertension Neg Hx   . Hypothyroidism Mother   . Diabetes Neg Hx   . Colon cancer Neg Hx   . Multiple sclerosis Brother     History   Social History  . Marital Status: Married    Spouse Name: N/A    Number of Children: N/A  . Years of Education: N/A   Social History Main Topics  . Smoking status: Never Smoker   . Smokeless tobacco: Never Used  . Alcohol Use: No  . Drug Use: No  . Sexual Activity: None   Other Topics Concern  . None   Social History Narrative   Review of Systems - See HPI.  All other ROS are negative.  BP 126/76 mmHg  Pulse 91  Temp(Src) 98.5 F (36.9 C) (Oral)  Resp 16  Ht 5\' 9"  (1.753 m)  Wt 208 lb 4 oz (94.462 kg)  BMI 30.74 kg/m2  SpO2 99%  Physical Exam  Constitutional: He is  oriented to person, place, and time and well-developed, well-nourished, and in no distress.  HENT:  Head: Normocephalic and atraumatic.  Right Ear: External ear normal.  Left Ear: External ear normal.  Nose: Nose normal.  Mouth/Throat: Oropharynx is clear and moist. No oropharyngeal exudate.  Eyes: Conjunctivae and EOM are normal.  Cardiovascular: Normal rate, regular rhythm, normal heart sounds and intact distal pulses.   Pulmonary/Chest: Effort normal and breath sounds normal. No respiratory distress. He has no wheezes. He has no rales. He exhibits no tenderness.  Neurological: He is alert and oriented to person, place, and time.  Skin: Skin is warm and dry. No rash noted.  Psychiatric: Affect normal.   Assessment/Plan: Migraines Improving but still present.  Patient with comorbid depression.  Increase to Effexor XR 150 mg daily.  Rx Fiorinal for abortive therapy.  Follow-up in 3 months.

## 2014-08-24 NOTE — Progress Notes (Signed)
Pre visit review using our clinic review tool, if applicable. No additional management support is needed unless otherwise documented below in the visit note/SLS  

## 2014-08-24 NOTE — Assessment & Plan Note (Signed)
Improving but still present.  Patient with comorbid depression.  Increase to Effexor XR 150 mg daily.  Rx Fiorinal for abortive therapy.  Follow-up in 3 months.

## 2014-09-11 ENCOUNTER — Telehealth: Payer: Self-pay | Admitting: Physician Assistant

## 2014-09-11 MED ORDER — BUTALBITAL-ASA-CAFF-CODEINE 50-325-40-30 MG PO CAPS: 1.0000 | ORAL_CAPSULE | ORAL | Status: DC | PRN

## 2014-09-11 NOTE — Telephone Encounter (Signed)
Will Rx Fiorinal with Codeine to help with migraines.  Rx has to be picked up.  Is ready and waiting at the front desk.

## 2014-09-11 NOTE — Telephone Encounter (Signed)
Caller name: Abran RichardHicks, Jovannie K Relation to pt: self  Call back number:[phone] Pharmacy:  [id number] - [place], [place] - [address] [phone] (Phone)     Reason for call:  Pt wanted to inform you butalbital-aspirin-caffeine [facility](FIORINAL) 50-325-40 MG per capsule is ok but not doing the job. Please advise

## 2014-09-14 NOTE — Telephone Encounter (Signed)
LMOM with contact name and number [for return call, if needed] RE: New Rx ready for pick-up at front desk during regular business hrs [given] per provider instructions/SLS

## 2014-10-12 ENCOUNTER — Telehealth: Payer: Self-pay | Admitting: Physician Assistant

## 2014-10-12 MED ORDER — BUTALBITAL-ASA-CAFF-CODEINE 50-325-40-30 MG PO CAPS: 1.0000 | ORAL_CAPSULE | ORAL | Status: DC | PRN

## 2014-10-12 NOTE — Telephone Encounter (Signed)
Medication Detail      Disp Refills Start End     butalbital-aspirin-caffeine-codeine [facility](FIORINAL WITH CODEINE) 50-325-40-30 MG capsule 60 capsule 0 09/11/2014     Sig - Route: Take 1 capsule by mouth every 4 (four) hours as needed for migraine. - Oral    Class: Print    Please Advise on refills/SLS

## 2014-10-12 NOTE — Telephone Encounter (Signed)
Rx printed and signed.  At front desk ready for pickup.

## 2014-10-12 NOTE — Telephone Encounter (Signed)
Caller name: Ayyan Relation to pt: self Call back number: [phone] Pharmacy:  Reason for call:   Patient requesting a new butalbital-aspirin-caffeine-codeine rx.

## 2014-10-13 NOTE — Telephone Encounter (Signed)
LMOM with contact name and number RE: Rx ready for p/u during regular business hours per provider instructions/SLS

## 2014-10-29 ENCOUNTER — Telehealth: Payer: Self-pay | Admitting: Physician Assistant

## 2014-10-29 NOTE — Telephone Encounter (Signed)
Caller name: Abran RichardHicks, Langston K Relation to pt: self  Call back number: [phone]   Reason for call:  Pt is flying out March  2nd requesting to pick up  butalbital-aspirin-caffeine-codeine (FIORINAL WITH CODEINE) 50-325-40-30 MG capsule before then. Please advise

## 2014-10-29 NOTE — Telephone Encounter (Signed)
Last filled: 10/12/14 Amt: 60, 0  Last OV:  08/24/14  Please advise.

## 2014-10-30 MED ORDER — BUTALBITAL-ASA-CAFF-CODEINE 50-325-40-30 MG PO CAPS: 1.0000 | ORAL_CAPSULE | ORAL | Status: DC | PRN

## 2014-10-30 NOTE — Telephone Encounter (Signed)
Refill granted.  Rx placed at front desk. Please call patient for pickup.

## 2014-10-30 NOTE — Telephone Encounter (Signed)
Pt notified and made aware 

## 2014-11-24 ENCOUNTER — Telehealth: Payer: Self-pay | Admitting: Physician Assistant

## 2014-11-24 NOTE — Telephone Encounter (Signed)
Caller name:Das, Jr Relation to ZO:XWRUpt:self Call back number:[phone] Pharmacy:  Reason for call: pt is needing rx butalbital-aspirin-caffeine-codeine (FIORINAL WITH CODEINE) 50-325-40-30 MG capsule , please call when available for pickup,

## 2014-11-25 ENCOUNTER — Other Ambulatory Visit: Payer: Self-pay | Admitting: Physician Assistant

## 2014-11-25 NOTE — Telephone Encounter (Signed)
Not due.  Last Rx could not be filled until March 1st.  Not due until April 1st.  If he is relying on this Medication that often, then he needs reassessment.  Otherwise, I will fill when due.

## 2014-11-25 NOTE — Telephone Encounter (Signed)
Last OV 08-24-14 Fiorinal last filled 10-30-14 #60 with 0   No CSC or UDS

## 2014-11-25 NOTE — Telephone Encounter (Signed)
Called pt and lmovm to inform that medication could not be filled until 12-04-14, and that he may need to make an appt if relying on medication fore often.

## 2014-11-26 NOTE — Telephone Encounter (Signed)
CAn pick up first thing Tuesday as Rx has to be printed and signed and I am out of office.

## 2014-11-26 NOTE — Telephone Encounter (Signed)
Absolutely.  I just wanted to make sure migraines were not worsening.

## 2014-11-26 NOTE — Telephone Encounter (Signed)
He did not want his meds prior to the refill date.  There was a mis communication on the last call.  He simply wanted to know when it was due for refill.  I informed him it is due on 12-04-2014. He travels a lot and then forgets to call.  Could he pick up his rx  On #-30 2016 dated for 12-04-2014 as He has to fly away on business on the 30th in the afternoon   [facility] will [facility] to [facility] [place] when the day arrives.

## 2014-12-01 ENCOUNTER — Telehealth: Payer: Self-pay | Admitting: *Deleted

## 2014-12-01 MED ORDER — BUTALBITAL-ASA-CAFF-CODEINE 50-325-40-30 MG PO CAPS: 1.0000 | ORAL_CAPSULE | ORAL | Status: DC | PRN

## 2014-12-01 NOTE — Telephone Encounter (Signed)
Rx ready for pick up. 

## 2014-12-01 NOTE — Telephone Encounter (Signed)
Medical record request received via fax from [facility] [employer]. Forwarded to [place] to scan/email to medical records. JG//CMA

## 2014-12-18 ENCOUNTER — Other Ambulatory Visit: Payer: Self-pay | Admitting: Physician Assistant

## 2014-12-30 ENCOUNTER — Telehealth: Payer: Self-pay | Admitting: Physician Assistant

## 2014-12-30 MED ORDER — BUTALBITAL-ASA-CAFF-CODEINE 50-325-40-30 MG PO CAPS: 1.0000 | ORAL_CAPSULE | ORAL | Status: DC | PRN

## 2014-12-30 NOTE — Telephone Encounter (Signed)
LVM advising pt to call office to schedule appointment

## 2014-12-30 NOTE — Telephone Encounter (Signed)
Refill granted.  Ready for pickup. He needs to schedule follow-up before further refills can be given.

## 2014-12-30 NOTE — Telephone Encounter (Signed)
Relation to ZO:XWRUpt:self Call back [phone]  Reason for call:  Pt requesting a refill butalbital-aspirin-caffeine-codeine (FIORINAL WITH CODEINE) 50-325-40-30 MG capsule .pt states he is leaving town Friday

## 2015-01-11 ENCOUNTER — Ambulatory Visit (INDEPENDENT_AMBULATORY_CARE_PROVIDER_SITE_OTHER): Payer: 59 | Admitting: Physician Assistant

## 2015-01-11 ENCOUNTER — Encounter: Payer: Self-pay | Admitting: Physician Assistant

## 2015-01-11 VITALS — BP 113/65 | HR 60 | Temp 98.0°F | Wt 200.0 lb

## 2015-01-11 DIAGNOSIS — F32A Depression, unspecified: Secondary | ICD-10-CM

## 2015-01-11 DIAGNOSIS — G43709 Chronic migraine without aura, not intractable, without status migrainosus: Secondary | ICD-10-CM

## 2015-01-11 DIAGNOSIS — F329 Major depressive disorder, single episode, unspecified: Secondary | ICD-10-CM

## 2015-01-11 MED ORDER — AMITRIPTYLINE HCL 10 MG PO TABS: 5.0000 mg | ORAL_TABLET | Freq: Every day | ORAL | Status: DC

## 2015-01-11 MED ORDER — VENLAFAXINE HCL ER 75 MG PO CP24: 75.0000 mg | ORAL_CAPSULE | Freq: Every day | ORAL | Status: DC

## 2015-01-11 NOTE — Assessment & Plan Note (Signed)
Will reduce Effexor to 75 mg daily to mitigate side effects.  Discussed importance of taking daily at same time to reduce chance of withdrawal symptoms.  Follow-up 1 month.

## 2015-01-11 NOTE — Patient Instructions (Signed)
Please start new medication (Elavil) nightly as directed. For the Venlafaxine XR, take as follows:    For 1 week, alternate the 150 mg tablet and 75 mg tablet every other day.    After that, start 75 mg tablet daily.  Continue Fioricet only if needed for severe migraines. If you start with a headache and is mild, please use Excedrin instead.

## 2015-01-11 NOTE — Progress Notes (Signed)
Pre visit review using our clinic review tool, if applicable. No additional management support is needed unless otherwise documented below in the visit note. 

## 2015-01-11 NOTE — Assessment & Plan Note (Signed)
Discussed judicious use of Fioricet. Excedrin for mild symptoms.  Will begin Elavil 5 mg nightly for mood and migraine prophylaxis.  Follow-up 1 month.

## 2015-01-11 NOTE — Progress Notes (Signed)
   Patient presents to clinic today for follow-up of migraines.  Endorses migraines a few times per week, well controlled with Fioricet.  Previously on Topamax for migraine prophylaxis without improvement. Is currently on Effexor but wishes to decrease dose as making him nauseated and is not preventing his headaches.  Denies new or worsening symptoms.   Past Medical History  Diagnosis Date  . Depression     no counseling  . Head ache     Current Outpatient Prescriptions on File Prior to Visit  Medication Sig Dispense Refill  . Aspirin-Acetaminophen-Caffeine (EXCEDRIN MIGRAINE PO) Take by mouth as needed.     . butalbital-aspirin-caffeine-codeine (FIORINAL WITH CODEINE) 50-325-40-30 MG capsule Take 1 capsule by mouth every 4 (four) hours as needed for migraine. 60 capsule 0  . fluticasone (FLONASE) 50 MCG/ACT nasal spray Place 2 sprays into the nose daily as needed for rhinitis. 16 g 3  . ibuprofen (ADVIL,MOTRIN) 200 MG tablet Take 200 mg by mouth every 6 (six) hours as needed for pain.     No current facility-administered medications on file prior to visit.    No Known Allergies  Family History  Problem Relation Age of Onset  . Alcohol abuse      maternal & paternal grandfather, & sister  . Heart disease Father     stent & bypass  . Prostate cancer Paternal Uncle   . Stroke Mother [id number]  . Hypertension Neg Hx   . Hypothyroidism Mother   . Diabetes Neg Hx   . Colon cancer Neg Hx   . Multiple sclerosis Brother     History   Social History  . Marital Status: Married    Spouse Name: N/A  . Number of Children: N/A  . Years of Education: N/A   Social History Main Topics  . Smoking status: Never Smoker   . Smokeless tobacco: Never Used  . Alcohol Use: No  . Drug Use: No  . Sexual Activity: Not on file   Other Topics Concern  . None   Social History Narrative   Review of Systems - See HPI.  All other ROS are negative.  BP 113/65 mmHg  Pulse 60  Temp(Src) 98 F (36.7  C)  Wt 200 lb (90.719 kg)  SpO2 99%  Physical Exam  Constitutional: He is oriented to person, place, and time and well-developed, well-nourished, and in no distress.  HENT:  Head: Normocephalic and atraumatic.  Eyes: Conjunctivae are normal.  Cardiovascular: Normal rate, regular rhythm, normal heart sounds and intact distal pulses.   Pulmonary/Chest: Effort normal and breath sounds normal. No respiratory distress. He has no wheezes. He has no rales. He exhibits no tenderness.  Neurological: He is alert and oriented to person, place, and time.  Skin: Skin is warm and dry. No rash noted.  Psychiatric: Affect normal.  Vitals reviewed.   No results found for this or any previous visit (from the past 2160 hour(s)).  Assessment/Plan: Migraines Discussed judicious use of Fioricet. Excedrin for mild symptoms.  Will begin Elavil 5 mg nightly for mood and migraine prophylaxis.  Follow-up 1 month.   Depression Will reduce Effexor to 75 mg daily to mitigate side effects.  Discussed importance of taking daily at same time to reduce chance of withdrawal symptoms.  Follow-up 1 month.

## 2015-01-20 ENCOUNTER — Other Ambulatory Visit: Payer: Self-pay | Admitting: Physician Assistant

## 2015-01-21 NOTE — Telephone Encounter (Signed)
Requesting Zoloft 50mg -Take 1 tablet by mouth every day. Rx is not on the pt's med list. Last refill:11-19-13;#90,0 Last OV:01-11-15-F/U in 1 month (no appt made) Please advise.//AB/CMA

## 2015-01-27 ENCOUNTER — Telehealth: Payer: Self-pay | Admitting: Physician Assistant

## 2015-01-27 MED ORDER — BUTALBITAL-ASA-CAFF-CODEINE 50-325-40-30 MG PO CAPS: 1.0000 | ORAL_CAPSULE | ORAL | Status: DC | PRN

## 2015-01-27 NOTE — Telephone Encounter (Signed)
Requesting Butalbital-aspirin-caffeine-codeine 50-325-40-30mg -Take 1 capsule by mouth every 4 hours as needed for migraine. Last refill:12/30/14;#60,0 Last OV:01/11/15 UDS:No done Please advise.//AB/CMA

## 2015-01-27 NOTE — Telephone Encounter (Signed)
Caller name: Sherre PootDaniel Richards Relationship to patient: self Can be reached: [phone]  Reason for call: Pt asking for refill on butalbital-aspirin-caffeine-codeine (FIORINAL WITH CODEINE) 50-325-40-30 MG capsule. He would like to pick up on Friday 01/29/15 and have filled. It will be 1 or 2 days early. Pt is leaving for [place] on Saturday 01/30/15. Please call when ready to pick up.

## 2015-01-27 NOTE — Telephone Encounter (Signed)
Medication refilled. At front desk. Will be last time it can be filled early.

## 2015-01-28 NOTE — Telephone Encounter (Signed)
Notified patient and he stated understanding, plans to pick up Rx today.

## 2015-03-01 ENCOUNTER — Telehealth: Payer: Self-pay | Admitting: Physician Assistant

## 2015-03-01 NOTE — Telephone Encounter (Signed)
Caller name: Raliegh Relation to pt: Call back number: [phone] Pharmacy:  Reason for call:   Requesting fiorenal with codeine rx.requesting this to be done by tomorrow afternoon? He is traveling

## 2015-03-01 NOTE — Telephone Encounter (Signed)
Remind him there is a 72 hour turn around for controlled substances. Should not keep requesting the evening before he is leaving town.  He is going through this medication way too fast.  Should not be using 60 tablets in 30 days.  If he is having breakthrough migraines that often, we need to alter his preventative medication.  I will allow one fill with 30 tablets.  He will need an appointment before any further refills will be given.

## 2015-03-02 ENCOUNTER — Other Ambulatory Visit: Payer: Self-pay | Admitting: Physician Assistant

## 2015-03-02 MED ORDER — BUTALBITAL-ASA-CAFF-CODEINE 50-325-40-30 MG PO CAPS: 1.0000 | ORAL_CAPSULE | Freq: Three times a day (TID) | ORAL | Status: DC | PRN

## 2015-03-02 NOTE — Telephone Encounter (Signed)
Left detailed message informing patient of this. °

## 2015-03-05 ENCOUNTER — Telehealth: Payer: Self-pay | Admitting: Physician Assistant

## 2015-03-05 NOTE — Telephone Encounter (Signed)
Caller name: Victorino DikeJennifer with [facility] in [place] Can be reached: [phone]  Reason for call: Pt at pharmacy in [place] wanting to fill butalbital-aspirin-caffeine-codeine [facility](FIORINAL WITH CODEINE) 50-325-40-30 MG capsule. They need provider approval since pt is out of state. Please call ASAP.

## 2015-03-05 NOTE — Telephone Encounter (Signed)
Called and spoke with Victorino DikeJennifer @ [facility] in [place],and informed her that [place] approved that the pt could get the prescription fill.//AB/CMA

## 2015-03-17 ENCOUNTER — Ambulatory Visit (INDEPENDENT_AMBULATORY_CARE_PROVIDER_SITE_OTHER): Payer: 59 | Admitting: Physician Assistant

## 2015-03-17 ENCOUNTER — Other Ambulatory Visit: Payer: Self-pay | Admitting: Physician Assistant

## 2015-03-17 ENCOUNTER — Encounter: Payer: Self-pay | Admitting: Physician Assistant

## 2015-03-17 VITALS — BP 116/79 | HR 64 | Temp 98.3°F | Ht 69.0 in | Wt 196.0 lb

## 2015-03-17 DIAGNOSIS — G43709 Chronic migraine without aura, not intractable, without status migrainosus: Secondary | ICD-10-CM

## 2015-03-17 MED ORDER — AMITRIPTYLINE HCL 10 MG PO TABS: 10.0000 mg | ORAL_TABLET | Freq: Every day | ORAL | Status: DC

## 2015-03-17 MED ORDER — BUTALBITAL-ASA-CAFF-CODEINE 50-325-40-30 MG PO CAPS: 1.0000 | ORAL_CAPSULE | Freq: Three times a day (TID) | ORAL | Status: DC | PRN

## 2015-03-17 NOTE — Patient Instructions (Signed)
Please continue the Effexor as directed. Increase Amitriptyline (Elavil) to one tablet (10mg ) at bedtime. Use Fiornal as directed only when needed for acute migraine.  I have sent in refills of your medications to the pharmacy to tie you over until you get a new PCP in [place].

## 2015-03-17 NOTE — Assessment & Plan Note (Signed)
Continue Effexor. Increase Elavil to 10 mg nightly. Fiorinal not to be used twice daily for prevention as is not a prophylactic medication. Only to be used for abortive therapy. This has been discussed in detail with patient who agrees to change regimen. Medications refilled. Patient to follow-up with new PCP in [place].

## 2015-03-17 NOTE — Progress Notes (Signed)
   Patient presents to clinic today for follow-up of migraines. Patient is currently on Effexor XR 75 mg daily and Elavil 5 mg nightly. Uses Fiorinal if needed for severe acute migraines. Is taking Fiorinal not as prescribed -- twice daily to help prevent migraines from occurring.  Endorses still having few migraines per week. Denies excess fatigue of hypersomnolence with 5 mg Elavil. Denies new symptoms. [place] this [place].  Past Medical History  Diagnosis Date  . Depression     no counseling  . Head ache     Current Outpatient Prescriptions on File Prior to Visit  Medication Sig Dispense Refill  . Aspirin-Acetaminophen-Caffeine (EXCEDRIN MIGRAINE PO) Take by mouth as needed.     . fluticasone (FLONASE) 50 MCG/ACT nasal spray Place 2 sprays into the nose daily as needed for rhinitis. 16 g 3  . ibuprofen (ADVIL,MOTRIN) 200 MG tablet Take 200 mg by mouth every 6 (six) hours as needed for pain.    Marland Kitchen. venlafaxine XR (EFFEXOR-XR) 75 MG 24 hr capsule Take 1 capsule (75 mg total) by mouth daily with breakfast. 30 capsule 3   No current facility-administered medications on file prior to visit.   No Known Allergies  Family History  Problem Relation Age of Onset  . Alcohol abuse      maternal & paternal grandfather, & sister  . Heart disease Father     stent & bypass  . Prostate cancer Paternal Uncle   . Stroke Mother [id number]  . Hypertension Neg Hx   . Hypothyroidism Mother   . Diabetes Neg Hx   . Colon cancer Neg Hx   . Multiple sclerosis Brother     History   Social History  . Marital Status: Married    Spouse Name: N/A  . Number of Children: N/A  . Years of Education: N/A   Social History Main Topics  . Smoking status: Never Smoker   . Smokeless tobacco: Never Used  . Alcohol Use: No  . Drug Use: No  . Sexual Activity: Not on file   Other Topics Concern  . None   Social History Narrative   Review of Systems - See HPI.  All other ROS are negative.  BP  116/79 mmHg  Pulse 64  Temp(Src) 98.3 F (36.8 C) (Oral)  Ht 5\' 9"  (1.753 m)  Wt 196 lb (88.905 kg)  BMI 28.93 kg/m2  SpO2 99%  Physical Exam  Constitutional: He is oriented to person, place, and time and well-developed, well-nourished, and in no distress.  HENT:  Head: Normocephalic and atraumatic.  Eyes: Conjunctivae are normal. Pupils are equal, round, and reactive to light.  Cardiovascular: Normal rate, regular rhythm, normal heart sounds and intact distal pulses.   Pulmonary/Chest: Effort normal and breath sounds normal. No respiratory distress.  Neurological: He is alert and oriented to person, place, and time.  Skin: Skin is warm and dry. No rash noted.  Psychiatric: Affect normal.  Vitals reviewed.   No results found for this or any previous visit (from the past 2160 hour(s)).  Assessment/Plan: Migraines Continue Effexor. Increase Elavil to 10 mg nightly. Fiorinal not to be used twice daily for prevention as is not a prophylactic medication. Only to be used for abortive therapy. This has been discussed in detail with patient who agrees to change regimen. Medications refilled. Patient to follow-up with new PCP in [place].

## 2015-03-17 NOTE — Progress Notes (Signed)
Pre visit review using our clinic review tool, if applicable. No additional management support is needed unless otherwise documented below in the visit note. 

## 2015-03-19 ENCOUNTER — Telehealth: Payer: Self-pay | Admitting: Physician Assistant

## 2015-03-19 MED ORDER — BUTALBITAL-ASA-CAFF-CODEINE 50-325-40-30 MG PO CAPS: 1.0000 | ORAL_CAPSULE | Freq: Three times a day (TID) | ORAL | Status: DC | PRN

## 2015-03-19 NOTE — Telephone Encounter (Signed)
Please see below.

## 2015-03-19 NOTE — Telephone Encounter (Signed)
Spoke to pharmacy and patient has just brought hard copy of Rx to pharmacy [facility] [place] [facility], they wanted to verify prescription.  Verified with pharmacist OK to fill.

## 2015-03-19 NOTE — Telephone Encounter (Signed)
Called patient's pharmacy and they stated that his last Rx has not been picked up at any pharmacy.  Is it OK to reprint and cancel at local pharmacy?

## 2015-03-19 NOTE — Telephone Encounter (Signed)
Relation to pt: self Call back number: [phone]  Pharmacy: [facility] [address], [place], [place] [id number] :[phone]    Reason for call:  Patient relocated please send butalbital-aspirin-caffeine-codeine [facility](FIORINAL WITH CODEINE) 50-325-40-30 MG capsule to [facility] [phone]

## 2015-03-19 NOTE — Telephone Encounter (Signed)
Ok to re print 

## 2015-04-14 ENCOUNTER — Encounter: Payer: PRIVATE HEALTH INSURANCE | Attending: Family Medicine | Primary: Family

## 2015-04-20 ENCOUNTER — Ambulatory Visit
Admit: 2015-04-20 | Discharge: 2015-04-20 | Payer: PRIVATE HEALTH INSURANCE | Attending: Family Medicine | Primary: Family

## 2015-04-20 DIAGNOSIS — M79641 Pain in right hand: Secondary | ICD-10-CM

## 2015-04-20 MED ORDER — VENLAFAXINE SR 75 MG 24 HR CAP
75 mg | ORAL_CAPSULE | Freq: Every day | ORAL | 5 refills | Status: DC
Start: 2015-04-20 — End: 2015-10-05

## 2015-04-20 MED ORDER — CODEINE-BUTALBITAL-ASA-CAFFEINE 30 MG-50 MG-325 MG-40 MG CAP
30-50-325-40 mg | ORAL_CAPSULE | Freq: Three times a day (TID) | ORAL | 0 refills | Status: DC | PRN
Start: 2015-04-20 — End: 2015-06-02

## 2015-04-20 NOTE — Progress Notes (Signed)
Chief Complaint   Patient presents with   ??? New Patient     establish care        HPI: Benjamin [name] is a 49 y.o. male who is here today for new patient with migraines and depresion.  Saw specialist for migraines.  Was supposed to get MRI but moved from the [place] area.  Was on higher dose of medications but can't recall what.  Has to take medications about 3 times a week.  Has not been taking elavil daily due to sedation.  Sumatriptan caused GI side effects.  Also tried Diclofenac.  Also tried Fioricet.  Has tried Topamax but does not recall it helping.    Effexor XR seems to be helping.  No side effects to speak off.      Past Medical History   Diagnosis Date   ??? Depression 2013     mild   ??? Migraines 2013     History reviewed. No pertinent past surgical history.  Current Outpatient Prescriptions   Medication Sig Dispense Refill   ??? amitriptyline (ELAVIL) 10 mg tablet nightly as needed.  5   ??? venlafaxine-SR (EFFEXOR-XR) 75 mg capsule Take 75 mg by mouth daily.  2   ??? codeine-butalbital-aspirin-caffeine (FIORINAL-CODEINE #3) 30-50-325-40 mg per capsule Take  by mouth every six (6) hours as needed for Pain.       No Known Allergies  Family History   Problem Relation Age of Onset   ??? Stroke Mother      mild   ??? Thyroid Disease Mother      hypothyroidism   ??? Heart Disease Father    ??? Heart Attack Father    ??? Other Maternal Grandmother      angina   ??? No Known Problems Maternal Grandfather    ??? Cancer Paternal Grandmother    ??? Cancer Paternal Grandfather    ??? Other Sister 35     Staph infection/ Guillian Barre   ??? Other Brother      quadraplegic due to MVA   ??? MS Brother    ??? No Known Problems Son    ??? No Known Problems Son    ??? No Known Problems Son    ??? No Known Problems Daughter      Social History     Social History   ??? Marital status: MARRIED     Spouse name: N/A   ??? Number of children: N/A   ??? Years of education: N/A     Social History Main Topics   ??? Smoking status: Never Smoker    ??? Smokeless tobacco: Never Used   ??? Alcohol use No   ??? Drug use: No   ??? Sexual activity: Yes     Partners: Female     Birth control/ protection: Surgical     Other Topics Concern   ??? None     Social History Narrative   ??? None       Review of Systems - History obtained from the patient  General ROS: negative for - chills, fatigue or fever  Psychological ROS: negative  Ophthalmic ROS: negative  ENT ROS: negative  Allergy and Immunology ROS: negative  Hematological and Lymphatic ROS: negative  Respiratory ROS: no cough, shortness of breath, or wheezing  Cardiovascular ROS: no chest pain or dyspnea on exertion  Gastrointestinal ROS: no abdominal pain, change in bowel habits, or black or bloody stools  Genito-Urinary ROS: no dysuria, trouble voiding, or hematuria  Musculoskeletal ROS:  negative  Neurological ROS: negative    Vitals:    04/20/15 1459   BP: 124/90   Pulse: 63   Temp: 98.9 ??F (37.2 ??C)   Weight: 194 lb 12.8 oz (88.4 kg)   Height: 5' 9.5" (1.765 m)         Physical Examination:   General appearance - alert, well appearing, and in no distress  Mental status - alert, oriented to person, place, and time  Eyes - pupils equal and reactive, extraocular eye movements intact  Ears - bilateral TM's and external ear canals normal  Nose - normal and patent, no erythema, discharge or polyps  Mouth - mucous membranes moist, pharynx normal without lesions  Neck - supple, no significant adenopathy  Lymphatics - no palpable lymphadenopathy, no hepatosplenomegaly  Chest - clear to auscultation, no wheezes, rales or rhonchi, symmetric air entry  Heart - normal rate, regular rhythm, normal S1, S2, no murmurs, rubs, clicks or gallops  Abdomen - soft, nontender, nondistended, no masses or organomegaly  Back exam - full range of motion, no tenderness, palpable spasm or pain on motion  Neurological - alert, oriented, normal speech, no focal findings or movement disorder noted   Musculoskeletal - no joint tenderness, deformity or swelling  Extremities - peripheral pulses normal, no pedal edema, no clubbing or cyanosis  Skin - normal coloration and turgor, no rashes, no suspicious skin lesions noted    Assessment and Plan:    1. Pain of right hand  New  Will get xray to r/o fracture  - X-Ray Hand, 2 views, Right; Future    2. Major depressive disorder with single episode, in full remission (HCC)  Stable  Doing well on chronic medication  Medication refilled  - venlafaxine-SR (EFFEXOR-XR) 75 mg capsule; Take 1 Cap by mouth daily.  Dispense: 30 Cap; Refill: 5    3. Migraine with aura and without status migrainosus, not intractable  Chronic- stable  Refilled medications today  Discussed possible rebound HA from use of narcotics, he has been stable on current regimen  - codeine-butalbital-aspirin-caffeine (FIORINAL-CODEINE #3) 30-50-325-40 mg per capsule; Take 1 Cap by mouth every eight (8) hours as needed for Pain. Max Daily Amount: 3 Caps.  Dispense: 60 Cap; Refill: 0    Morley Kos, MD

## 2015-04-21 NOTE — Telephone Encounter (Signed)
Please tell mr. Kervin that his xray was negative for fractures. As an incidental finding it showed  Shrapnel or some sort of debris in the 2nd knuckle.  Has he had any recent injury where he had open skin or penetrating injury?    Morley Kos, MD

## 2015-04-21 NOTE — Telephone Encounter (Signed)
Returned call to patient and left a second voice mail for patient to please return call when available

## 2015-04-21 NOTE — Telephone Encounter (Signed)
Left voice mail for patient to please return call when available

## 2015-04-22 NOTE — Telephone Encounter (Signed)
I agree.    [name], MD

## 2015-04-22 NOTE — Telephone Encounter (Signed)
Must have been old injury.  Otherwise xray is negative.  If he develops swelling or redness please let me know.  As long as he is not having problems then we will just monitor. The area where he has been hurting is normal on xray. Apply cold compresses and use OTC advil or aleve.    [name], MD

## 2015-04-22 NOTE — Telephone Encounter (Signed)
Patient states no, he states he was playing football w/ his kids about two months ago (trying to tackle a 47 year old w/o hurting them) and states he thinks that is when it happened but there was no open skin or anything along those lines.

## 2015-04-22 NOTE — Telephone Encounter (Signed)
Spoke w/ patient and he states understanding. He states it hurts most when witting so he wasn't sure how long he should waiting to call us, I stated that was up to him depending on pain

## 2015-05-24 ENCOUNTER — Telehealth: Payer: Self-pay | Admitting: Physician Assistant

## 2015-05-24 MED ORDER — BUTALBITAL-ASA-CAFF-CODEINE 50-325-40-30 MG PO CAPS: 1.0000 | ORAL_CAPSULE | Freq: Three times a day (TID) | ORAL | Status: DC | PRN

## 2015-05-24 NOTE — Telephone Encounter (Signed)
Notified pt to p/u RX and that appt will be needed if he needs additional refills.

## 2015-05-24 NOTE — Telephone Encounter (Signed)
One fill granted. Last refill from office without appointment. Rx at front desk for pickup.

## 2015-05-24 NOTE — Telephone Encounter (Signed)
Pt called for refill on butalbital-aspirin-caffeine-codeine (FIORINAL WITH CODEINE) 50-325-40-30 MG capsule. He is still commuting bw [employer] and [place]. He is waiting to move there to establish a pcp. He has 1 dose left. He is here in the area today and tomorrow if we can please get RX ready for him. Call with issues ph# [phone].

## 2015-06-02 ENCOUNTER — Encounter

## 2015-06-03 MED ORDER — CODEINE-BUTALBITAL-ASA-CAFFEINE 30 MG-50 MG-325 MG-40 MG CAP
30-50-325-40 mg | ORAL_CAPSULE | Freq: Three times a day (TID) | ORAL | 0 refills | Status: DC | PRN
Start: 2015-06-03 — End: 2015-07-26

## 2015-06-03 NOTE — Telephone Encounter (Signed)
Left detailed voice mail for patient regarding RX ready for pick up

## 2015-06-28 ENCOUNTER — Encounter

## 2015-06-28 NOTE — Telephone Encounter (Signed)
Patient is leaving the country (going to [place]) on 10/27 and returning 11/18. Patient does have plenty of pills but it concerned about running out in his absence.

## 2015-06-28 NOTE — Telephone Encounter (Signed)
Unfortunately it is a controlled substance and can not be refilled before it is due.    [name], MD

## 2015-06-28 NOTE — Telephone Encounter (Signed)
Patient is concerned about going cold [place] on the medication. I advised him that there could be a strong side effect due to doing that and that he should not be taking it multiple times a day. He states he is really not taking it multiple times a day but would like to know what the side effects of going cold [place] for a week or so. He wants to know what to expect.

## 2015-06-29 NOTE — Telephone Encounter (Addendum)
Patient states understanding and was advised that he may take Ex migraine 12 hours after taking the medication

## 2015-06-29 NOTE — Telephone Encounter (Signed)
Rebound headaches, or worse headaches, nausea/vomiting, cold sweats. Try weaning off the medication, try not to the medication every day. Take only when the headache is severe.    Benjamin KosManuel A Dorna-Pesquera, MD

## 2015-07-07 ENCOUNTER — Ambulatory Visit: Payer: 59 | Admitting: Physician Assistant

## 2015-07-26 ENCOUNTER — Encounter

## 2015-07-27 ENCOUNTER — Encounter

## 2015-07-27 NOTE — Telephone Encounter (Signed)
From: Benjamin [name]  To: [name], MD  Sent: 07/26/2015 9:35 PM EST  Subject:  Medication Renewal Request    Original  authorizing provider: [name], MD    Benjamin [name]  Benjamin [name] would like a refill of the following medications:  codeine-butalbital-aspirin-caffeine  (FIORINAL-CODEINE #3) 30-50-325-40 mg per capsule Morley Kos[[name], MD]    Preferred  pharmacy: [id number] - [place], [place] - [address] [facility]  [address]    Comment:

## 2015-07-28 MED ORDER — CODEINE-BUTALBITAL-ASA-CAFFEINE 30 MG-50 MG-325 MG-40 MG CAP
30-50-325-40 mg | ORAL_CAPSULE | Freq: Three times a day (TID) | ORAL | 0 refills | Status: DC | PRN
Start: 2015-07-28 — End: 2015-10-05

## 2015-08-02 ENCOUNTER — Encounter: Payer: PRIVATE HEALTH INSURANCE | Attending: Family Medicine | Primary: Family

## 2015-08-02 NOTE — Telephone Encounter (Signed)
Appointment cancelled per patient's request

## 2015-08-06 ENCOUNTER — Encounter: Payer: Self-pay | Admitting: Physician Assistant

## 2015-08-06 ENCOUNTER — Ambulatory Visit (INDEPENDENT_AMBULATORY_CARE_PROVIDER_SITE_OTHER): Payer: 59 | Admitting: Physician Assistant

## 2015-08-06 VITALS — BP 118/77 | HR 73 | Temp 98.1°F | Resp 16 | Ht 69.0 in | Wt 195.5 lb

## 2015-08-06 DIAGNOSIS — G43709 Chronic migraine without aura, not intractable, without status migrainosus: Secondary | ICD-10-CM

## 2015-08-06 DIAGNOSIS — Z23 Encounter for immunization: Secondary | ICD-10-CM | POA: Insufficient documentation

## 2015-08-06 MED ORDER — AMITRIPTYLINE HCL 10 MG PO TABS: 10.0000 mg | ORAL_TABLET | Freq: Every day | ORAL | Status: DC

## 2015-08-06 MED ORDER — VENLAFAXINE HCL ER 75 MG PO CP24: 75.0000 mg | ORAL_CAPSULE | Freq: Every day | ORAL | Status: DC

## 2015-08-06 MED ORDER — BUTALBITAL-ASA-CAFF-CODEINE 50-325-40-30 MG PO CAPS: 1.0000 | ORAL_CAPSULE | Freq: Three times a day (TID) | ORAL | Status: DC | PRN

## 2015-08-06 NOTE — Patient Instructions (Signed)
Please continue medications as directed with the exception of increasing the Elavil to 15 mg (1.5 tablets daily).   Stay well hydrated and eat a well-balanced diet.  Follow-up with me in a couple of weeks via the phone to let me know how the new dose is working.

## 2015-08-06 NOTE — Progress Notes (Signed)
Pre visit review using our clinic review tool, if applicable. No additional management support is needed unless otherwise documented below in the visit note/SLS  

## 2015-08-06 NOTE — Progress Notes (Signed)
   Patient presents to clinic today for follow-up of migraine headaches. Is taking his Effexor and Amitriptyline as directed. Is making dietary changes. Patient endorses headaches are decreased to 3 per week from one almost daily, but the biggest change is the severity is much improved. Endorses headaches are very mild. Fioricet only used for severe headaches. Is using excedrin for mild headaches.  Past Medical History  Diagnosis Date  . Depression     no counseling  . Head ache     Current Outpatient Prescriptions on File Prior to Visit  Medication Sig Dispense Refill  . Aspirin-Acetaminophen-Caffeine (EXCEDRIN MIGRAINE PO) Take by mouth as needed.     . fluticasone (FLONASE) 50 MCG/ACT nasal spray Place 2 sprays into the nose daily as needed for rhinitis. 16 g 3  . ibuprofen (ADVIL,MOTRIN) 200 MG tablet Take 200 mg by mouth every 6 (six) hours as needed for pain.     No current facility-administered medications on file prior to visit.    No Known Allergies  Family History  Problem Relation Age of Onset  . Alcohol abuse      maternal & paternal grandfather, & sister  . Heart disease Father     stent & bypass  . Prostate cancer Paternal Uncle   . Stroke Mother [id number]  . Hypertension Neg Hx   . Hypothyroidism Mother   . Diabetes Neg Hx   . Colon cancer Neg Hx   . Multiple sclerosis Brother     Social History   Social History  . Marital Status: Married    Spouse Name: N/A  . Number of Children: N/A  . Years of Education: N/A   Social History Main Topics  . Smoking status: Never Smoker   . Smokeless tobacco: Never Used  . Alcohol Use: No  . Drug Use: No  . Sexual Activity: Not Asked   Other Topics Concern  . None   Social History Narrative   Review of Systems - See HPI.  All other ROS are negative.  BP 118/77 mmHg  Pulse 73  Temp(Src) 98.1 F (36.7 C) (Oral)  Resp 16  Ht 5\' 9"  (1.753 m)  Wt 195 lb 8 oz (88.678 kg)  BMI 28.86 kg/m2  SpO2 97%  Physical Exam    Constitutional: He is oriented to person, place, and time and well-developed, well-nourished, and in no distress.  HENT:  Head: Normocephalic and atraumatic.  Neck: Neck supple.  Cardiovascular: Normal rate, regular rhythm, normal heart sounds and intact distal pulses.   Pulmonary/Chest: Effort normal and breath sounds normal.  Neurological: He is alert and oriented to person, place, and time.  Skin: Skin is warm and dry. No rash noted.  Psychiatric: Affect normal.  Vitals reviewed.  No results found for this or any previous visit (from the past 2160 hour(s)).  Assessment/Plan: Migraines Will continue Effexor. Will increase Elavil to 15 mg. Continue abortive therapy. Follow-up if symptoms not improving. Patient encouraged to consider headache clinic.  Need for prophylactic vaccination and inoculation against influenza Flu shot given.

## 2015-08-06 NOTE — Assessment & Plan Note (Signed)
Will continue Effexor. Will increase Elavil to 15 mg. Continue abortive therapy. Follow-up if symptoms not improving. Patient encouraged to consider headache clinic.

## 2015-08-06 NOTE — Assessment & Plan Note (Signed)
Flu shot given

## 2015-10-05 ENCOUNTER — Encounter

## 2015-10-05 MED ORDER — VENLAFAXINE SR 75 MG 24 HR CAP
75 mg | ORAL_CAPSULE | Freq: Every day | ORAL | 5 refills | Status: DC
Start: 2015-10-05 — End: 2015-11-17

## 2015-10-05 MED ORDER — CODEINE-BUTALBITAL-ASA-CAFFEINE 30 MG-50 MG-325 MG-40 MG CAP
30-50-325-40 mg | ORAL_CAPSULE | Freq: Three times a day (TID) | ORAL | 0 refills | Status: DC | PRN
Start: 2015-10-05 — End: 2015-12-06

## 2015-10-05 NOTE — Telephone Encounter (Signed)
From: Sherre Poot  To: Morley Kos, MD  Sent: 10/05/2015 9:17 AM EST  Subject:  Medication Renewal Request    Original  authorizing provider: Morley Kos, MD    Sherre Poot would like a refill of the following medications:  venlafaxine-SR  (EFFEXOR-XR) 75 mg capsule [[name], MD]  codeine-butalbital-aspirin-caffeine  (FIORINAL-CODEINE #3) 30-50-325-40 mg per capsule Morley Kos, MD]    Preferred  pharmacy: [id number] - [place], [place] - [address] AT [facility] OF [facility]  [address]    Comment:

## 2015-10-08 ENCOUNTER — Ambulatory Visit
Admit: 2015-10-08 | Discharge: 2015-10-08 | Payer: PRIVATE HEALTH INSURANCE | Attending: Family Medicine | Primary: Family

## 2015-10-08 DIAGNOSIS — Z0000 Encounter for general adult medical examination without abnormal findings: Secondary | ICD-10-CM

## 2015-10-08 LAB — AMB POC URINALYSIS DIP STICK MANUAL W/ MICRO
Amorphous Crystals: 0
Bilirubin (UA POC): NEGATIVE
Blood (UA POC): NEGATIVE
Casts: 0 [HPF] (ref 0–1)
Crystals (UA POC): NEGATIVE
Glucose (UA POC): NEGATIVE
Ketones (UA POC): NEGATIVE
Leukocyte esterase (UA POC): NEGATIVE
Nitrites (UA POC): NEGATIVE
Other:: 0
Protein (UA POC): NEGATIVE mg/dL
RBC: 0 [HPF] (ref 0–3)
Specific gravity (UA POC): 1.025 (ref 1.003–1.035)
Urobilinogen (UA POC): 0.2 (ref 0.2–1)
pH (UA POC): 6 (ref 4.6–8.0)

## 2015-10-08 MED ORDER — MOMETASONE 0.1 % TOPICAL SOLN
0.1 % | CUTANEOUS | 5 refills | Status: DC
Start: 2015-10-08 — End: 2016-02-04

## 2015-10-08 NOTE — Progress Notes (Signed)
Chief Complaint   Patient presents with   ??? Physical     physical w/ fasting; mole on back w/ bleeding; scalp rash w/ scabs       Benjamin [name] is a 50 y.o. male who is here for a CPE.  In general patient does  follow a good diet and good exercise plan, staying active.  Patient does not drink ETOH and does not smoke.  Sees ophthalmologist and dentist regularly See ROS below  Had flu shot at [facility]  Had Tdap while living in [place] a few years ago, will bring records.    Past Medical History   Diagnosis Date   ??? Depression 2013     mild   ??? Migraines 2013       Current Outpatient Prescriptions   Medication Sig Dispense Refill   ??? venlafaxine-SR (EFFEXOR-XR) 75 mg capsule Take 1 Cap by mouth daily. 30 Cap 5   ??? codeine-butalbital-aspirin-caffeine (FIORINAL-CODEINE #3) 30-50-325-40 mg per capsule Take 1 Cap by mouth every eight (8) hours as needed for Pain. Max Daily Amount: 3 Caps. 60 Cap 0   ??? amitriptyline (ELAVIL) 10 mg tablet nightly as needed.  5       History reviewed. No pertinent past surgical history.    Social History     Social History   ??? Marital status: MARRIED     Spouse name: N/A   ??? Number of children: N/A   ??? Years of education: N/A     Occupational History   ??? Not on file.     Social History Main Topics   ??? Smoking status: Never Smoker   ??? Smokeless tobacco: Never Used   ??? Alcohol use No   ??? Drug use: No   ??? Sexual activity: Yes     Partners: Female     Birth control/ protection: Surgical     Other Topics Concern   ??? Not on file     Social History Narrative       Family History   Problem Relation Age of Onset   ??? Stroke Mother      mild   ??? Thyroid Disease Mother      hypothyroidism   ??? Heart Failure Mother    ??? Heart Disease Father    ??? Heart Attack Father    ??? Other Maternal Grandmother      angina   ??? No Known Problems Maternal Grandfather    ??? Cancer Paternal Grandmother    ??? Cancer Paternal Grandfather    ??? Other Sister 44     Staph infection/ Guillian Barre   ??? Other Brother       quadraplegic due to MVA   ??? MS Brother    ??? No Known Problems Son    ??? No Known Problems Son    ??? No Known Problems Son    ??? No Known Problems Daughter        No Known Allergies        Review of Systems - General ROS: negative for - chills, fatigue, fever, malaise, night sweats, weight gain or weight loss  Ophthalmic ROS: negative for - decreased vision, eye pain or loss of vision  ENT ROS: negative for - epistaxis, headaches, hearing change, nasal congestion, sinus pain, sore throat or tinnitus  Allergy and Immunology ROS: negative for - hives, itchy/watery eyes, nasal congestion or seasonal allergies  Hematological and Lymphatic ROS: negative for - bleeding problems, bruising, night sweats, swollen lymph nodes  or weight loss  Endocrine ROS: negative for - breast changes, galactorrhea, hot flashes, mood swings, polydipsia/polyuria or unexpected weight changes  Breast ROS: negative for - changes  Respiratory ROS: negative for - cough, orthopnea, shortness of breath, tachypnea or wheezing  Cardiovascular ROS: negative for - chest pain, dyspnea on exertion, edema, irregular heartbeat, loss of consciousness, orthopnea, palpitations, paroxysmal nocturnal dyspnea, rapid heart rate or shortness of breath  Gastrointestinal ROS: negative for - abdominal pain, appetite loss, blood in stools, change in bowel habits, change in stools, constipation, diarrhea or heartburn  Genito-Urinary ROS: negative for - urinary hesitancy, urinary frequency, weak urinary stream, testicle lumps or pain, penile discharge  Musculoskeletal ROS: negative for - gait disturbance, joint pain, joint stiffness, joint swelling or muscular weakness  Neurological ROS: negative for - gait disturbance, headaches, impaired coordination/balance, memory loss, numbness/tingling, seizures, tremors or weakness  Dermatological ROS: negative for - dry skin, eczema, hair changes, mole on back bleeding. Scalp red and flaky and itches.          Vitals:     10/08/15 0827   BP: 122/78   Pulse: 76   Temp: 97.5 ??F (36.4 ??C)   Weight: 195 lb 9.6 oz (88.7 kg)   Height: 5' 9.5" (1.765 m)         Physical Examination:   General appearance - alert, well appearing, and in no distress, oriented to person, place, and time, weight slightly above normal, in no respiratory distress  Mental status - alert, oriented to person, place, and time, normal mood, behavior, speech, dress, motor activity, and thought processes  Eyes - pupils equal and reactive, extraocular eye movements intact, sclera anicteric  Ears - bilateral TM's and external ear canals normal  Nose - normal and patent, no erythema, discharge or polyps  Mouth - mucous membranes moist, pharynx normal without lesions, tonsils normal, dental hygiene good and tongue normal  Neck - supple, no significant adenopathy, carotids upstroke normal bilaterally, no bruits  Thyroid exam: thyroid is normal in size without nodules or tenderness  Lymphatics - no palpable lymphadenopathy, no hepatosplenomegaly  Chest - clear to auscultation, no wheezes, rales or rhonchi, symmetric air entry  Heart - normal rate, regular rhythm, normal S1, S2, no murmurs, rubs, clicks or gallops  Abdomen - soft, nontender, nondistended, no masses or organomegaly  GU - Normal genitalia, no testicular masses or hernias, Prostate exam deferred.  Back exam - full range of motion, no tenderness, palpable spasm or pain on motion  Neurological - alert, oriented, normal speech, no focal findings or movement disorder noted  Musculoskeletal - no joint tenderness, deformity or swelling  Extremities - peripheral pulses normal, no pedal edema, no clubbing or cyanosis  Skin - normal coloration and turgor, no rashes, no suspicious skin lesions noted.  1cm x 1cm regular smooth mole suggestive of SK. Scalp red with scaley flakes.       Assessment/plan    1. Routine general medical examination at a health care facility  Wellness exam  Labs pending   Discussed diet and exercise  Discussed possible migraine triggers  Will bring Immunization triggers  - Urinalysis w/ Micro (81000)  - Lipid Panel (81017)  - CBC With Differential/Platelet (51025)  - Comp. Metabolic Panel (14) (85277)  - TSH (84443)    2. Psoriasis of scalp  New  Will treat with Mometasone lotion apply bid  - mometasone (ELOCON) 0.1 % lotion; Apply to scalp bid  Dispense: 30 mL; Refill: 5  Patient appears to be in good health.  Encouraged or reinforced healthy diet/lifestyle.  Discussed and encouraged appropriate screenings and vaccines and administered if patient allowed.  Will review screening labs with patient as they return. Due to his age and general risk profile we did not elect to send hemoccult care kit with patient  .  Adriana Simas, MD

## 2015-10-08 NOTE — Patient Instructions (Addendum)
Well Visit, Ages 18 to 50: Care Instructions  Your Care Instructions  Physical exams can help you stay healthy. Your doctor has checked your overall health and may have suggested ways to take good care of yourself. He or she also may have recommended tests. At home, you can help prevent illness with healthy eating, regular exercise, and other steps.  Follow-up care is a key part of your treatment and safety. Be sure to make and go to all appointments, and call your doctor if you are having problems. It's also a good idea to know your test results and keep a list of the medicines you take.  How can you care for yourself at home?  ?? Reach and stay at a healthy weight. This will lower your risk for many problems, such as obesity, diabetes, heart disease, and high blood pressure.  ?? Get at least 30 minutes of physical activity on most days of the week. Walking is a good choice. You also may want to do other activities, such as running, swimming, cycling, or playing tennis or team sports. Discuss any changes in your exercise program with your doctor.  ?? Do not smoke or allow others to smoke around you. If you need help quitting, talk to your doctor about stop-smoking programs and medicines. These can increase your chances of quitting for good.  ?? Talk to your doctor about whether you have any risk factors for sexually transmitted infections (STIs). Having one sex partner (who does not have STIs and does not have sex with anyone else) is a good way to avoid these infections.  ?? Use birth control if you do not want to have children at this time. Talk with your doctor about the choices available and what might be best for you.  ?? Protect your skin from too much sun. When you're outdoors from 10 a.m. to 4 p.m., stay in the shade or cover up with clothing and a hat with a wide brim. Wear sunglasses that block UV rays. Even when it's cloudy, put broad-spectrum sunscreen (SPF 30 or higher) on any exposed skin.   ?? See a dentist one or two times a year for checkups and to have your teeth cleaned.  ?? Wear a seat belt in the car.  ?? Drink alcohol in moderation, if at all. That means no more than 2 drinks a day for men and 1 drink a day for women.  Follow your doctor's advice about when to have certain tests. These tests can spot problems early.  For everyone  ?? Cholesterol. Have the fat (cholesterol) in your blood tested after age 20. Your doctor will tell you how often to have this done based on your age, family history, or other things that can increase your risk for heart disease.  ?? Blood pressure. Have your blood pressure checked during a routine doctor visit. Your doctor will tell you how often to check your blood pressure based on your age, your blood pressure results, and other factors.  ?? Vision. Talk with your doctor about how often to have a glaucoma test.  ?? Diabetes. Ask your doctor whether you should have tests for diabetes.  ?? Colon cancer. Have a test for colon cancer at age 50. You may have one of several tests. If you are younger than 50, you may need a test earlier if you have any risk factors. Risk factors include whether you already had a precancerous polyp removed from your colon or whether your parent, brother,   sister, or child has had colon cancer.  For women  ?? Breast exam and mammogram. Talk to your doctor about when you should have a clinical breast exam and a mammogram. Medical experts differ on whether and how often women under 50 should have these tests. Your doctor can help you decide what is right for you.  ?? Pap test and pelvic exam. Begin Pap tests at age 55. A Pap test is the best way to find cervical cancer. The test often is part of a pelvic exam. Ask how often to have this test.  ?? Tests for sexually transmitted infections (STIs). Ask whether you should have tests for STIs. You may be at risk if you have sex with more than one person, especially if your partners do not wear condoms.   For men  ?? Tests for sexually transmitted infections (STIs). Ask whether you should have tests for STIs. You may be at risk if you have sex with more than one person, especially if you do not wear a condom.  ?? Testicular cancer exam. Ask your doctor whether you should check your testicles regularly.  ?? Prostate exam. Talk to your doctor about whether you should have a blood test (called a PSA test) for prostate cancer. Experts differ on whether and when men should have this test. Some experts suggest it if you are older than 27 and are African-American or have a father or brother who got prostate cancer when he was younger than 66.  When should you call for help?  Watch closely for changes in your health, and be sure to contact your doctor if you have any problems or symptoms that concern you.  Where can you learn more?  Go to [employer].  Enter P072 in the search box to learn more about "Well Visit, Ages 69 to 83: Care Instructions."  Current as of: March 23, 2015  Content Version: 11.1  ?? 2006-2016 [employer]. Care instructions adapted under license by [employer] (which disclaims liability or warranty for this information). If you have questions about a medical condition or this instruction, always ask your healthcare professional. [employer] disclaims any warranty or liability for your use of this information.       Recurring Migraine Headache: Care Instructions  Your Care Instructions  Migraines are painful, throbbing headaches. They often start on one side of the head. They may cause nausea and vomiting and make you sensitive to light, sound, or smell. Some people may have only a few migraines throughout life. Others have them as often as several times a month.  The goal of treatment is to reduce the number of migraines you have and relieve your symptoms. Even with treatment, you may continue to have  migraines. You play an important role in dealing with your headaches. Work on avoiding things that seem to trigger your migraines. When you feel a headache coming on, act quickly to stop it before it gets worse.  Follow-up care is a key part of your treatment and safety. Be sure to make and go to all appointments, and call your doctor if you are having problems. It's also a good idea to know your test results and keep a list of the medicines you take.  How can you care for yourself at home?  ?? Do not drive if you have taken a prescription pain medicine.  ?? Rest in a quiet, dark room until your headache is gone. Close your eyes and try to relax or  go to sleep. Do not watch TV or read.  ?? Put a cold, moist cloth or cold pack on the painful area for 10 to 20 minutes at a time. Put a thin cloth between the cold pack and your skin.  ?? Have someone gently massage your neck and shoulders.  ?? Take your medicines exactly as prescribed. Call your doctor if you think you are having a problem with your medicine. You will get more details on the specific medicines your doctor prescribes.  To prevent migraines  ?? Keep a headache diary so you can figure out what triggers your headaches. Avoiding triggers may help you prevent headaches. Record when each headache began, how long it lasted, and what the pain was like. Use words like throbbing, aching, stabbing, or dull. Write down any other symptoms you had with the headache. These may include nausea, flashing lights or dark spots, or sensitivity to bright light or loud noise. Note if the headache occurred near your period. List anything that might have triggered the headache. Triggers may include certain foods (chocolate, cheese, wine) or odors, smoke, bright light, stress, or lack of sleep.  ?? If your doctor has prescribed medicine for your migraines, take it as directed. You may have medicine that you take only when you get a migraine  and medicine that you take all the time to help prevent migraines.  ?? If your doctor has prescribed medicine for when you get a headache, take it at the first sign of a migraine, unless your doctor has given you other instructions.  ?? If your doctor has prescribed medicine to prevent migraines, take it exactly as prescribed. Call your doctor if you think you are having a problem with your medicine.  ?? Find healthy ways to deal with stress. Migraines are most common during or right after stressful times. Take time to relax before and after you do something that has caused a migraine in the past.  ?? Try to keep your muscles relaxed by keeping good posture. Check your jaw, face, neck, and shoulder muscles for tension. Try to relax them. When sitting at a desk, change positions often. Stretch for 30 seconds each hour.  ?? Get regular sleep and exercise.  ?? Eat regular meals, and avoid foods and drinks that often trigger migraines. These include chocolate and alcohol, especially red wine and port. Chemicals used in food, such as aspartame and monosodium glutamate (MSG), also can trigger migraines. So can some food additives, such as those found in hot dogs, bacon, cold cuts, aged cheeses, and pickled foods.  ?? Limit caffeine by not drinking too much coffee, tea, or soda. Do not quit caffeine suddenly, because that can also give you migraines.  ?? Do not smoke or allow others to smoke around you. If you need help quitting, talk to your doctor about stop-smoking programs and medicines. These can increase your chances of quitting for good.  ?? If you are taking birth control pills or hormone therapy, talk to your doctor about whether they are triggering your migraines.  When should you call for help?  Call 911 anytime you think you may need emergency care. For example, call if:  ?? You have symptoms of a stroke. These may include:  ?? Sudden numbness, tingling, weakness, or loss of movement in your face,  arm, or leg, especially on only one side of your body.  ?? Sudden vision changes.  ?? Sudden trouble speaking.  ?? Sudden confusion or trouble understanding simple  statements.  ?? Sudden problems with walking or balance.  ?? A sudden, severe headache that is different from past headaches.  Call your doctor now or seek immediate medical care if:  ?? You develop a fever and a stiff neck.  ?? You have new nausea and vomiting, or you cannot keep down food or liquids.  Watch closely for changes in your health, and be sure to contact your doctor if:  ?? You have a headache that does not get better within 1 or 2 days.  ?? Your headaches get worse or happen more often.  Where can you learn more?  Go to [employer].  Enter V975 in the search box to learn more about "Recurring Migraine Headache: Care Instructions."  Current as of: October 23, 2014  Content Version: 11.1  ?? 2006-2016 [employer]. Care instructions adapted under license by [employer] (which disclaims liability or warranty for this information). If you have questions about a medical condition or this instruction, always ask your healthcare professional. [employer] disclaims any warranty or liability for your use of this information.

## 2015-10-09 LAB — METABOLIC PANEL, COMPREHENSIVE
A-G Ratio: 1.9 (ref 1.1–2.5)
ALT (SGPT): 27 IU/L (ref 0–44)
AST (SGOT): 17 IU/L (ref 0–40)
Albumin: 4.6 g/dL (ref 3.5–5.5)
Alk. phosphatase: 75 IU/L (ref 39–117)
BUN/Creatinine ratio: 10 (ref 9–20)
BUN: 12 mg/dL (ref 6–24)
Bilirubin, total: 0.3 mg/dL (ref 0.0–1.2)
CO2: 25 mmol/L (ref 18–29)
Calcium: 9.4 mg/dL (ref 8.7–10.2)
Chloride: 99 mmol/L (ref 96–106)
Creatinine: 1.2 mg/dL (ref 0.76–1.27)
GFR est AA: 83 mL/min/{1.73_m2} (ref >59)
GFR est non-AA: 72 mL/min/{1.73_m2} (ref >59)
GLOBULIN, TOTAL: 2.4 g/dL (ref 1.5–4.5)
Glucose: 80 mg/dL (ref 65–99)
Potassium: 4.5 mmol/L (ref 3.5–5.2)
Protein, total: 7 g/dL (ref 6.0–8.5)
Sodium: 141 mmol/L (ref 134–144)

## 2015-10-09 LAB — CBC WITH AUTOMATED DIFF
ABS. BASOPHILS: 0.1 10*3/uL (ref 0.0–0.2)
ABS. EOSINOPHILS: 0.2 10*3/uL (ref 0.0–0.4)
ABS. IMM. GRANS.: 0 10*3/uL (ref 0.0–0.1)
ABS. MONOCYTES: 0.7 10*3/uL (ref 0.1–0.9)
ABS. NEUTROPHILS: 3.4 10*3/uL (ref 1.4–7.0)
Abs Lymphocytes: 1.8 10*3/uL (ref 0.7–3.1)
BASOPHILS: 1 %
EOSINOPHILS: 3 %
HCT: 42.7 % (ref 37.5–51.0)
HGB: 14.9 g/dL (ref 12.6–17.7)
IMMATURE GRANULOCYTES: 0 %
Lymphocytes: 29 %
MCH: 31.4 pg (ref 26.6–33.0)
MCHC: 34.9 g/dL (ref 31.5–35.7)
MCV: 90 fL (ref 79–97)
MONOCYTES: 12 %
NEUTROPHILS: 55 %
PLATELET: 218 10*3/uL (ref 150–379)
RBC: 4.75 x10E6/uL (ref 4.14–5.80)
RDW: 13.7 % (ref 12.3–15.4)
WBC: 6.1 10*3/uL (ref 3.4–10.8)

## 2015-10-09 LAB — LIPID PANEL
Cholesterol, total: 179 mg/dL (ref 100–199)
HDL Cholesterol: 39 mg/dL — ABNORMAL LOW (ref >39)
LDL, calculated: 102 mg/dL — ABNORMAL HIGH (ref 0–99)
Triglyceride: 190 mg/dL — ABNORMAL HIGH (ref 0–149)
VLDL, calculated: 38 mg/dL (ref 5–40)

## 2015-10-09 LAB — TSH 3RD GENERATION: TSH: 3.9 u[IU]/mL (ref 0.450–4.500)

## 2015-10-11 NOTE — Progress Notes (Signed)
Labs are ok.  Bad cholesterol LDL at 102 (<100), triglycerides 190 (<150).  Work on [employer] and exercise more.  Weight loss and proper diet and exercise should be to control this without the need of medications. The rest of the labs are great.  Morley Kos, MD

## 2015-10-11 NOTE — Progress Notes (Signed)
Sent to patient through MyChart

## 2015-10-12 NOTE — Progress Notes (Signed)
LVM for patient regarding lab results, asked to please check MyChart or return my call when available

## 2015-10-12 NOTE — Progress Notes (Signed)
Patient read in MyChart

## 2015-10-19 ENCOUNTER — Telehealth: Payer: Self-pay | Admitting: Physician Assistant

## 2015-10-19 NOTE — Telephone Encounter (Signed)
Caller name: Self  Can be reached: [phone]  Pharmacy:  [id number] - [place], [place] - [address] AT [facility] OF [facility] [address] [phone] (Phone) [phone] (Fax)       Reason for call:  butalbital-aspirin-caffeine-codeine [facility] WITH CODEINE) 50-325-40-30 MG capsule [[id number]]

## 2015-10-19 NOTE — Telephone Encounter (Signed)
Requesting Butalbital-aspirin-caffeine-codeine 50-325-40-30mg -Take 1 capsule by mouth every 8 hours as needed for migraine. Last refill:08/06/15;#60,0 Last OV:08/06/15 No UDS Please advise.//AB/CMA

## 2015-10-20 MED ORDER — BUTALBITAL-ASA-CAFF-CODEINE 50-325-40-30 MG PO CAPS: 1.0000 | ORAL_CAPSULE | Freq: Three times a day (TID) | ORAL | Status: DC | PRN

## 2015-10-20 NOTE — Telephone Encounter (Signed)
Refill granted. Rx printed and signed. Can be picked up. He will have to give UDS sample at pick up.

## 2015-10-20 NOTE — Telephone Encounter (Signed)
Called and [facility] @ 9:22am @ [phone]) informing the pt of the note below.//AB/CMA

## 2015-10-25 MED FILL — BUTALBITAL COMP/COD #3 CAP: 50-325-40-3 | 20 days supply | Qty: 60 | Fill #0

## 2015-11-17 ENCOUNTER — Encounter

## 2015-11-17 MED ORDER — VENLAFAXINE SR 75 MG 24 HR CAP
75 mg | ORAL_CAPSULE | Freq: Every day | ORAL | 5 refills | Status: DC
Start: 2015-11-17 — End: 2016-01-22

## 2015-11-17 NOTE — Telephone Encounter (Signed)
From: Benjamin [name] Macha  To: Benjamin KosManuel A Dorna-Pesquera, MD  Sent: 11/17/2015 2:53 PM EDT  Subject:  Medication Renewal Request    Original  authorizing provider: [name], MD    Benjamin [name]  [name] would like a refill of the following medications:  venlafaxine-SR  (EFFEXOR-XR) 75 mg capsule Benjamin Kos[[name], MD]    Preferred  pharmacy[id number] - [place], [place] - [address] AT [facility]  [address]    Comment:

## 2015-11-23 ENCOUNTER — Encounter: Payer: PRIVATE HEALTH INSURANCE | Attending: Family Medicine | Primary: Family

## 2015-12-06 ENCOUNTER — Encounter

## 2015-12-06 NOTE — Telephone Encounter (Signed)
From: Benjamin [name]  To: [name], MD  Sent: 12/06/2015 3:28 PM EDT  Subject:  Medication Renewal Request    OriginaSherre Pootl  authorizing provider: [name], MD    [name] would like a refill of the following medications:  codeine-butalbital-aspirin-caffeine  (FIORINAL-CODEINE #3) 30-50-325-40 mg per capsule Morley Kos[[name], MD]    [facility]  [id number] - [place], [place] - [address] [facility]    Comment:

## 2015-12-07 MED ORDER — CODEINE-BUTALBITAL-ASA-CAFFEINE 30 MG-50 MG-325 MG-40 MG CAP
30-50-325-40 mg | ORAL_CAPSULE | Freq: Three times a day (TID) | ORAL | 0 refills | Status: DC | PRN
Start: 2015-12-07 — End: 2016-02-04

## 2015-12-28 ENCOUNTER — Encounter

## 2015-12-28 NOTE — Progress Notes (Signed)
Placed referral to Neurologist as requested by patient.   [name], MD

## 2015-12-29 ENCOUNTER — Ambulatory Visit (INDEPENDENT_AMBULATORY_CARE_PROVIDER_SITE_OTHER): Payer: 59 | Admitting: Physician Assistant

## 2015-12-29 ENCOUNTER — Encounter: Payer: Self-pay | Admitting: Physician Assistant

## 2015-12-29 VITALS — BP 113/76 | HR 94 | Temp 98.3°F | Ht 69.0 in | Wt 193.8 lb

## 2015-12-29 DIAGNOSIS — G43709 Chronic migraine without aura, not intractable, without status migrainosus: Secondary | ICD-10-CM

## 2015-12-29 DIAGNOSIS — F329 Major depressive disorder, single episode, unspecified: Secondary | ICD-10-CM

## 2015-12-29 DIAGNOSIS — F32A Depression, unspecified: Secondary | ICD-10-CM

## 2015-12-29 DIAGNOSIS — Z6828 Body mass index (BMI) 28.0-28.9, adult: Secondary | ICD-10-CM | POA: Diagnosis not present

## 2015-12-29 MED ORDER — BUTALBITAL-ASA-CAFF-CODEINE 50-325-40-30 MG PO CAPS: 1.0000 | ORAL_CAPSULE | Freq: Three times a day (TID) | ORAL | Status: DC | PRN

## 2015-12-29 MED ORDER — AMITRIPTYLINE HCL 25 MG PO TABS: 25.0000 mg | ORAL_TABLET | Freq: Every day | ORAL | Status: DC

## 2015-12-29 MED FILL — AMITRIPTYLINE HCL 25 MG TAB: 25 | 30 days supply | Qty: 30 | Fill #0

## 2015-12-29 MED FILL — BUTALBITAL COMP/COD #3 CAP: 50-325-40-3 | 20 days supply | Qty: 60 | Fill #0

## 2015-12-29 NOTE — Patient Instructions (Signed)
Please continue chronic medications as directed. Start the new dose of the Elavil as directed each night. Stay hydrated. Do not skip meals.  If symptoms are not improving with new regimen, we will need imaging and to refer you to Neurology.  Follow-up 1 month.

## 2015-12-29 NOTE — Progress Notes (Signed)
Patient presents to clinic today for follow-up of migraines. Is currently on regimen of Effexor 75 mg and Amitriptyline 10 mg nightly. This regimen has helped significantly with depression and anxiety. Is sleeping better. Is helping with migraines but patient endorses migraine last week associated with quick onset. Endorses an episode of vertigo and dizziness, lasting overnight. Did note nasal congestion, PND and ear pressure at that time that have since resolved. Denies recurrence of symptoms.   Past Medical History  Diagnosis Date  . Depression     no counseling  . Head ache     Current Outpatient Prescriptions on File Prior to Visit  Medication Sig Dispense Refill  . Aspirin-Acetaminophen-Caffeine (EXCEDRIN MIGRAINE PO) Take by mouth as needed.     . fluticasone (FLONASE) 50 MCG/ACT nasal spray Place 2 sprays into the nose daily as needed for rhinitis. 16 g 3  . ibuprofen (ADVIL,MOTRIN) 200 MG tablet Take 200 mg by mouth every 6 (six) hours as needed for pain.    Marland Kitchen. venlafaxine XR (EFFEXOR-XR) 75 MG 24 hr capsule Take 1 capsule (75 mg total) by mouth daily with breakfast. 30 capsule 3   No current facility-administered medications on file prior to visit.    No Known Allergies  Family History  Problem Relation Age of Onset  . Alcohol abuse      maternal & paternal grandfather, & sister  . Heart disease Father     stent & bypass  . Prostate cancer Paternal Uncle   . Stroke Mother [id number]  . Hypertension Neg Hx   . Hypothyroidism Mother   . Diabetes Neg Hx   . Colon cancer Neg Hx   . Multiple sclerosis Brother     Social History   Social History  . Marital Status: Married    Spouse Name: N/A  . Number of Children: N/A  . Years of Education: N/A   Social History Main Topics  . Smoking status: Never Smoker   . Smokeless tobacco: Never Used  . Alcohol Use: No  . Drug Use: No  . Sexual Activity: Not Asked   Other Topics Concern  . None   Social History Narrative    Review of Systems - See HPI.  All other ROS are negative.  BP 113/76 mmHg  Pulse 94  Temp(Src) 98.3 F (36.8 C) (Oral)  Ht 5\' 9"  (1.753 m)  Wt 193 lb 12.8 oz (87.907 kg)  BMI 28.61 kg/m2  SpO2 98%  Physical Exam  Constitutional: He is oriented to person, place, and time and well-developed, well-nourished, and in no distress.  HENT:  Head: Normocephalic and atraumatic.  Right Ear: External ear normal.  Left Ear: External ear normal.  Nose: Nose normal.  Mouth/Throat: Oropharynx is clear and moist. No oropharyngeal exudate.  TM within normal limits bilaterally  Eyes: Conjunctivae are normal.  Neck: Neck supple.  Cardiovascular: Normal rate, regular rhythm, normal heart sounds and intact distal pulses.   Pulmonary/Chest: Effort normal and breath sounds normal. No respiratory distress. He has no wheezes. He has no rales. He exhibits no tenderness.  Neurological: He is alert and oriented to person, place, and time.  Skin: Skin is warm and dry. No rash noted.  Psychiatric: Affect normal.  Vitals reviewed.  Assessment/Plan: Migraines Recent migraine seems a combination of his typical migraine with sinus congestion and headache. Exam within normal limits. NO residual symptoms. Discussed supportive measures. Will continue Effexor at current dose. Increase Elavil to 25 mg nightly. FU 1 month.  Depression  Doing very well on current regimen. Will continue Effexor. Elavil increased to 25 mg for sleep and migraine prophylaxis since patient cannot tolerate higher dose of Effexor.

## 2015-12-29 NOTE — Progress Notes (Signed)
Pre visit review using our clinic review tool, if applicable. No additional management support is needed unless otherwise documented below in the visit note. 

## 2015-12-30 NOTE — Assessment & Plan Note (Signed)
Recent migraine seems a combination of his typical migraine with sinus congestion and headache. Exam within normal limits. NO residual symptoms. Discussed supportive measures. Will continue Effexor at current dose. Increase Elavil to 25 mg nightly. FU 1 month.

## 2015-12-30 NOTE — Assessment & Plan Note (Signed)
Doing very well on current regimen. Will continue Effexor. Elavil increased to 25 mg for sleep and migraine prophylaxis since patient cannot tolerate higher dose of Effexor.

## 2016-01-10 ENCOUNTER — Encounter: Payer: PRIVATE HEALTH INSURANCE | Attending: Neurology | Primary: Family

## 2016-01-22 ENCOUNTER — Telehealth

## 2016-01-25 MED ORDER — VENLAFAXINE SR 75 MG 24 HR CAP
75 mg | ORAL_CAPSULE | Freq: Every day | ORAL | 5 refills | Status: DC
Start: 2016-01-25 — End: 2016-03-23

## 2016-01-25 NOTE — Telephone Encounter (Signed)
I sent refill.    [name], MD

## 2016-01-25 NOTE — Telephone Encounter (Signed)
Sent to patient through MYCH[employer]

## 2016-02-04 ENCOUNTER — Encounter

## 2016-02-04 MED ORDER — MOMETASONE 0.1 % TOPICAL SOLN
0.1 % | CUTANEOUS | 5 refills | Status: DC
Start: 2016-02-04 — End: 2017-10-30

## 2016-02-04 MED ORDER — CODEINE-BUTALBITAL-ASA-CAFFEINE 30 MG-50 MG-325 MG-40 MG CAP
30-50-325-40 mg | ORAL_CAPSULE | Freq: Three times a day (TID) | ORAL | 0 refills | Status: DC | PRN
Start: 2016-02-04 — End: 2016-03-23

## 2016-02-04 NOTE — Telephone Encounter (Signed)
From: Benjamin [name]  To: [name], MD  Sent: 02/04/2016 8:59 AM EDT  Subject:  Medication Renewal Request    Original  authorizing provider: [name], MD    Benjamin Pootaniel  [name] would like a refill of the following medications:  mometasone  (ELOCON) 0.1 % lotion [[name], MD]  codeine-butalbital-aspirin-caffeine  (FIORINAL-CODEINE #3) 30-50-325-40 mg per capsule Morley Kos[[name], MD]    Pref[facility]  [facility]: [id number] - [place], [place] - [address] [facility]    Comment:

## 2016-03-01 ENCOUNTER — Other Ambulatory Visit: Payer: Self-pay | Admitting: Physician Assistant

## 2016-03-03 ENCOUNTER — Encounter: Payer: Self-pay | Admitting: Family Medicine

## 2016-03-03 ENCOUNTER — Other Ambulatory Visit: Payer: Self-pay | Admitting: Emergency Medicine

## 2016-03-03 ENCOUNTER — Ambulatory Visit (INDEPENDENT_AMBULATORY_CARE_PROVIDER_SITE_OTHER): Payer: 59 | Admitting: Family Medicine

## 2016-03-03 VITALS — BP 116/80 | HR 85 | Temp 98.7°F | Ht 69.0 in | Wt 196.6 lb

## 2016-03-03 DIAGNOSIS — G43809 Other migraine, not intractable, without status migrainosus: Secondary | ICD-10-CM

## 2016-03-03 DIAGNOSIS — R200 Anesthesia of skin: Secondary | ICD-10-CM

## 2016-03-03 DIAGNOSIS — M25531 Pain in right wrist: Secondary | ICD-10-CM | POA: Diagnosis not present

## 2016-03-03 DIAGNOSIS — R202 Paresthesia of skin: Secondary | ICD-10-CM

## 2016-03-03 DIAGNOSIS — G5601 Carpal tunnel syndrome, right upper limb: Secondary | ICD-10-CM

## 2016-03-03 MED ORDER — BUTALBITAL-ASA-CAFF-CODEINE 50-325-40-30 MG PO CAPS: 1.0000 | ORAL_CAPSULE | Freq: Three times a day (TID) | ORAL | Status: DC | PRN

## 2016-03-03 MED FILL — BUTALBITAL COMP-CODEINE #3: 50-325-40-3 | 20 days supply | Qty: 60 | Fill #0

## 2016-03-03 NOTE — Patient Instructions (Signed)

## 2016-03-03 NOTE — Progress Notes (Signed)
Pre visit review using our clinic review tool, if applicable. No additional management support is needed unless otherwise documented below in the visit note. 

## 2016-03-03 NOTE — Telephone Encounter (Signed)
Received the following message from pt:   I have about a week's supply but [place] on [place], [place] and want to make sure I dont run out   Is it ok to refill? Please advise. Thanks

## 2016-03-03 NOTE — Telephone Encounter (Signed)
Ok to give #30  

## 2016-03-05 DIAGNOSIS — R200 Anesthesia of skin: Secondary | ICD-10-CM | POA: Insufficient documentation

## 2016-03-05 DIAGNOSIS — M25531 Pain in right wrist: Secondary | ICD-10-CM | POA: Insufficient documentation

## 2016-03-05 DIAGNOSIS — R202 Paresthesia of skin: Secondary | ICD-10-CM

## 2016-03-05 NOTE — Progress Notes (Signed)
Patient ID: Bradley Richards, male    DOB: [birth year]  Age: 50 y.o. MRN: [id number]    Subjective:  Subjective HPI Bradley RichardDaniel K Talkington presents for c/o r wrist numbness and arm pain  Review of Systems  Constitutional: Negative for diaphoresis, appetite change, fatigue and unexpected weight change.  Eyes: Negative for pain, redness and visual disturbance.  Respiratory: Negative for cough, chest tightness, shortness of breath and wheezing.   Cardiovascular: Negative for chest pain, palpitations and leg swelling.  Endocrine: Negative for cold intolerance, heat intolerance, polydipsia, polyphagia and polyuria.  Genitourinary: Negative for dysuria, frequency and difficulty urinating.  Neurological: Positive for numbness. Negative for dizziness, light-headedness and headaches.    History Past Medical History  Diagnosis Date  . Depression     no counseling  . Head ache     He has past surgical history that includes No past surgeries (12/11/2011).   His family history includes Heart disease in his father; Hypothyroidism in his mother; Multiple sclerosis in his brother; Prostate cancer in his paternal uncle; Stroke (age of onset: 4870) in his mother. There is no history of Hypertension, Diabetes, or Colon cancer.He reports that he has never smoked. He has never used smokeless tobacco. He reports that he does not drink alcohol or use illicit drugs.  Current Outpatient Prescriptions on File Prior to Visit  Medication Sig Dispense Refill  . amitriptyline (ELAVIL) 25 MG tablet Take 1 tablet (25 mg total) by mouth at bedtime. 30 tablet 3  . Aspirin-Acetaminophen-Caffeine (EXCEDRIN MIGRAINE PO) Take by mouth as needed.     . fluticasone (FLONASE) 50 MCG/ACT nasal spray Place 2 sprays into the nose daily as needed for rhinitis. 16 g 3  . ibuprofen (ADVIL,MOTRIN) 200 MG tablet Take 200 mg by mouth every 6 (six) hours as needed for pain.    Marland Kitchen. venlafaxine XR (EFFEXOR-XR) 75 MG 24 hr capsule Take 1 capsule (75  mg total) by mouth daily with breakfast. 30 capsule 3   No current facility-administered medications on file prior to visit.     Objective:  Objective Physical Exam  Constitutional: He is oriented to person, place, and time. Vital signs are normal. He appears well-developed and well-nourished. He is sleeping.  HENT:  Head: Normocephalic and atraumatic.  Mouth/Throat: Oropharynx is clear and moist.  Eyes: EOM are normal. Pupils are equal, round, and reactive to light.  Neck: Normal range of motion. Neck supple. No thyromegaly present.  Cardiovascular: Normal rate and regular rhythm.   No murmur heard. Pulmonary/Chest: Effort normal and breath sounds normal. No respiratory distress. He has no wheezes. He has no rales. He exhibits no tenderness.  Musculoskeletal: He exhibits no edema or tenderness.  Neurological: He is alert and oriented to person, place, and time.  Numbness in thumb to 4th finger  Flexion wrist worsens numbness but he also has pain/ tingling to elbow  Skin: Skin is warm and dry.  Psychiatric: He has a normal mood and affect. His behavior is normal. Judgment and thought content normal.  Nursing note and vitals reviewed.  BP 116/80 mmHg  Pulse 85  Temp(Src) 98.7 F (37.1 C) (Oral)  Ht 5\' 9"  (1.753 m)  Wt 196 lb 9.6 oz (89.177 kg)  BMI 29.02 kg/m2  SpO2 98% Wt Readings from Last 3 Encounters:  03/03/16 196 lb 9.6 oz (89.177 kg)  12/29/15 193 lb 12.8 oz (87.907 kg)  08/06/15 195 lb 8 oz (88.678 kg)     Lab Results  Component Value Date  WBC 5.7 05/19/2014   HGB 14.6 05/19/2014   HCT 43.0 05/19/2014   PLT 204.0 05/19/2014   GLUCOSE 86 05/19/2014   CHOL 182 05/19/2014   TRIG 234.0* 05/19/2014   HDL 32.80* 05/19/2014   LDLDIRECT 120.9 05/19/2014   LDLCALC 91 05/07/2013   ALT 42 05/19/2014   AST 26 05/19/2014   NA 137 05/19/2014   K 4.2 05/19/2014   CL 102 05/19/2014   CREATININE 1.1 05/19/2014   BUN 16 05/19/2014   CO2 28 05/19/2014   TSH 2.78  05/19/2014   HGBA1C 5.5 05/19/2014    Dg Shoulder Left  08/03/2008  Clinical Data: Pain, no recent trauma  LEFT SHOULDER - 2+ VIEW  Comparison: None  Findings: The glenohumeral joint space appears normal.  The AC joint is normally aligned.  No acute bony abnormality is seen  IMPRESSION: .  Negative Provider: Erskine Squibbebbie Price    Assessment & Plan:  Plan I am having Mr. Willa RoughHicks maintain his Aspirin-Acetaminophen-Caffeine (EXCEDRIN MIGRAINE PO), fluticasone, ibuprofen, venlafaxine XR, amitriptyline, mometasone, and butalbital-aspirin-caffeine-codeine.  Meds ordered this encounter  Medications  . mometasone (ELOCON) 0.1 % lotion    Sig: APP EXT TO THE SCALP  AS NEEDED    Refill:  5  . butalbital-aspirin-caffeine-codeine (FIORINAL WITH CODEINE) 50-325-40-30 MG capsule    Sig: Take 1 capsule by mouth every 8 (eight) hours as needed for migraine.    Dispense:  60 capsule    Refill:  0    Problem List Items Addressed This Visit      Unprioritized   Numbness and tingling of right arm   Right wrist pain    Splint To ortho if symptoms persist        Other Visit Diagnoses    Carpal tunnel syndrome of right wrist    -  Primary    Other type of migraine        Relevant Medications    butalbital-aspirin-caffeine-codeine (FIORINAL WITH CODEINE) 50-325-40-30 MG capsule       Follow-up: Return if symptoms worsen or fail to improve.  Donato SchultzYvonne R Lowne Chase, DO

## 2016-03-05 NOTE — Assessment & Plan Note (Signed)
Splint To ortho if symptoms persist

## 2016-03-22 ENCOUNTER — Encounter: Attending: Family Medicine | Primary: Family

## 2016-03-23 ENCOUNTER — Ambulatory Visit
Admit: 2016-03-23 | Discharge: 2016-03-23 | Payer: PRIVATE HEALTH INSURANCE | Attending: Family Medicine | Primary: Family

## 2016-03-23 DIAGNOSIS — G43109 Migraine with aura, not intractable, without status migrainosus: Secondary | ICD-10-CM

## 2016-03-23 MED ORDER — VENLAFAXINE SR 75 MG 24 HR CAP
75 mg | ORAL_CAPSULE | Freq: Every day | ORAL | 5 refills | Status: DC
Start: 2016-03-23 — End: 2017-10-30

## 2016-03-23 MED ORDER — CODEINE-BUTALBITAL-ASA-CAFFEINE 30 MG-50 MG-325 MG-40 MG CAP
30-50-325-40 mg | ORAL_CAPSULE | Freq: Three times a day (TID) | ORAL | 0 refills | Status: DC | PRN
Start: 2016-03-23 — End: 2016-05-09

## 2016-03-23 NOTE — Patient Instructions (Addendum)
Carpal Tunnel Syndrome: Care Instructions  Your Care Instructions    Carpal tunnel syndrome is a nerve problem. It can cause tingling, numbness, weakness, or pain in the fingers, thumb, and hand. The median nerve and several tough tissues called tendons run through a space in the wrist called the carpal tunnel. The repeated hand motions used in work and some hobbies and sports can put pressure on the nerve. Pregnancy and several conditions, including diabetes, arthritis, and an underactive thyroid, also can cause carpal tunnel syndrome.  You may be able to limit an activity or do it differently to reduce your symptoms. You also can take other steps to feel better. If your symptoms are mild, 1 to 2 weeks of home treatment are likely to ease your pain. Surgery is needed only if other treatments do not work.  Follow-up care is a key part of your treatment and safety. Be sure to make and go to all appointments, and call your doctor if you are having problems. It's also a good idea to know your test results and keep a list of the medicines you take.  How can you care for yourself at home?  ?? If possible, stop or reduce the activity that causes your symptoms. If you cannot stop the activity, take frequent breaks to rest and stretch or change hand positions to do a task. Try switching hands, such as when using a computer mouse.  ?? Try to avoid bending or twisting your wrists.  ?? Ask your doctor if you can take an over-the-counter pain medicine, such as acetaminophen (Tylenol), ibuprofen (Advil, Motrin), or naproxen (Aleve). Be safe with medicines. Read and follow all instructions on the label.  ?? If your doctor prescribes corticosteroid medicine to help reduce pain and swelling, take it exactly as prescribed. Call your doctor if you think you are having a problem with your medicine.  ?? Put ice or a cold pack on your wrist for 10 to 20 minutes at a time to ease pain. Put a thin cloth between the ice and your skin.   ?? If your doctor or your physical or occupational therapist tells you to wear a wrist splint, wear it as directed to keep your wrist in a neutral position. This also eases pressure on your median nerve.  ?? Ask your doctor whether you should have physical or occupational therapy to learn how to do tasks differently.  ?? Try a yoga class to stretch your muscles and build strength in your hands and wrists. Yoga has been shown to ease carpal tunnel symptoms.  To prevent carpal tunnel  ?? When working at a computer, keep your hands and wrists in line with your forearms. Hold your elbows close to your sides. Take a break every 10 to 15 minutes.  ?? Try these exercises:  ?? Warm up: Rotate your wrist up, down, and from side to side. Repeat this 4 times. Stretch your fingers far apart, relax them, then stretch them again. Repeat 4 times. Stretch your thumb by pulling it back gently, holding it, and then releasing it. Repeat 4 times.  ?? Prayer stretch: Start with your palms together in front of your chest just below your chin. Slowly lower your hands toward your waistline while keeping your hands close to your stomach and your palms together until you feel a mild to moderate stretch under your forearms. Hold for 10 to 20 seconds. Repeat 4 times.  ?? Wrist flexor stretch: Hold your arm in front of you   with your palm up. Bend your wrist, pointing your hand toward the floor. With your other hand, gently bend your wrist further until you feel a mild to moderate stretch in your forearm. Hold for 10 to 20 seconds. Repeat 4 times.  ?? Wrist extensor stretch: Repeat the steps for the wrist flexor stretch, but begin with your extended hand palm down.  ?? Squeeze a rubber exercise ball several times a day to keep your hands and fingers strong.  ?? Avoid holding objects (such as a book) in one position for a long time. When possible, use your whole hand to grasp an object. Using just the thumb and index finger can put stress on the wrist.   ?? Do not smoke. It can make this condition worse by reducing blood flow to the median nerve. If you need help quitting, talk to your doctor about stop-smoking programs and medicines. These can increase your chances of quitting for good.  When should you call for help?  Watch closely for changes in your health, and be sure to contact your doctor if:  ?? Your pain or other problems do not get better with home care.  ?? You want more information about physical or occupational therapy.  ?? You have side effects of your corticosteroid medicine, such as:  ?? Weight gain.  ?? Mood changes.  ?? Trouble sleeping.  ?? Bruising easily.  ?? You have any other problems with your medicine.  Where can you learn more?  Go to http://www.healthwise.net/GoodHelpConnections.  Enter R432 in the search box to learn more about "Carpal Tunnel Syndrome: Care Instructions."  Current as of: November 23, 2015  Content Version: 11.3  ?? 2006-2017 [employer]. Care instructions adapted under license by [employer] (which disclaims liability or warranty for this information). If you have questions about a medical condition or this instruction, always ask your healthcare professional. [employer]wise, [employer] disclaims any warranty or liability for your use of this information.       Carpal Tunnel Syndrome: Exercises  Your Care Instructions  Here are some examples of typical rehabilitation exercises for your condition. Start each exercise slowly. Ease off the exercise if you start to have pain.  Your doctor or your physical or occupational therapist will tell you when you can start these exercises and which ones will work best for you.  How to do the exercises  Note: When you no longer have pain or numbness, you can do exercises to help prevent carpal tunnel syndrome from coming back. Do not do any stretch or movement that is uncomfortable or painful.  Warm-up stretches   1. Rotate your wrist up, down, and from side to side. Repeat 4 times.  2. Stretch your fingers far apart. Relax them, and then stretch them again. Repeat 4 times.  3. Stretch your thumb by pulling it back gently, holding it, and then releasing it. Repeat 4 times.  Prayer stretch    1. Start with your palms together in front of your chest just below your chin.  2. Slowly lower your hands toward your waistline, keeping your hands close to your stomach and your palms together until you feel a mild to moderate stretch under your forearms.  3. Hold for at least 15 to 30 seconds. Repeat 2 to 4 times.  Wrist flexor stretch    1. Extend your arm in front of you with your palm up.  2. Bend your wrist, pointing your hand toward the floor.  3. With your   other hand, gently bend your wrist farther until you feel a mild to moderate stretch in your forearm.  4. Hold for at least 15 to 30 seconds. Repeat 2 to 4 times.  Wrist extensor stretch    Repeat steps 1 through 4 of the stretch above, but begin with your extended hand palm down.  Follow-up care is a key part of your treatment and safety. Be sure to make and go to all appointments, and call your doctor if you are having problems. It's also a good idea to know your test results and keep a list of the medicines you take.  Where can you learn more?  Go to http://www.healthwise.net/GoodHelpConnections.  Enter U908 in the search box to learn more about "Carpal Tunnel Syndrome: Exercises."  Current as of: November 23, 2015  Content Version: 11.3  ?? 2006-2017 [employer]. Care instructions adapted under license by [employer] (which disclaims liability or warranty for this information). If you have questions about a medical condition or this instruction, always ask your healthcare professional. [employer] disclaims any warranty or liability for your use of this information.

## 2016-03-23 NOTE — Progress Notes (Signed)
Chief Complaint   Patient presents with   ??? Medication Refill     medication refills; tingiling in R index and thumb that radiates up the arm to the elbow x 3 wks        HPI: Benjamin [name] is a 50 y.o. male who is here today for follow up on headaches and depression  Tingling on index and thumb of rt hand. He types a lot. Radiates to elbow. Comes and goes. No pain.  No injury. No discoloration. Occ pain distal index with manipulation.   Migraines; since Dec lat year; 3 episode of severe that caused n/v.   Medications for depression are working well. He has noticed that if he misses a dose he feels really bad.     Past Medical History:   Diagnosis Date   ??? Depression 2013    mild   ??? Migraines 2013     History reviewed. No pertinent surgical history.  Current Outpatient Prescriptions   Medication Sig Dispense Refill   ??? mometasone (ELOCON) 0.1 % lotion Apply to scalp bid 30 mL 5   ??? codeine-butalbital-aspirin-caffeine (FIORINAL-CODEINE #3) 30-50-325-40 mg per capsule Take 1 Cap by mouth every eight (8) hours as needed for Pain. Max Daily Amount: 3 Caps. 60 Cap 0   ??? venlafaxine-SR (EFFEXOR-XR) 75 mg capsule Take 1 Cap by mouth daily. 30 Cap 5   ??? amitriptyline (ELAVIL) 10 mg tablet nightly as needed.  5     No Known Allergies  Family History   Problem Relation Age of Onset   ??? Stroke Mother      mild   ??? Thyroid Disease Mother      hypothyroidism   ??? Heart Failure Mother    ??? Heart Disease Father    ??? Heart Attack Father    ??? Other Maternal Grandmother      angina   ??? No Known Problems Maternal Grandfather    ??? Cancer Paternal Grandmother    ??? Cancer Paternal Grandfather    ??? Other Sister [id number]     Staph infection/ Guillian Barre   ??? Other Brother      quadraplegic due to MVA   ??? MS Brother    ??? No Known Problems Son    ??? No Known Problems Son    ??? No Known Problems Son    ??? No Known Problems Daughter      Social History     Social History   ??? Marital status: MARRIED     Spouse name: N/A   ??? Number of children: N/A    ??? Years of education: N/A     Social History Main Topics   ??? Smoking status: Never Smoker   ??? Smokeless tobacco: Never Used   ??? Alcohol use No   ??? Drug use: No   ??? Sexual activity: Yes     Partners: Female     Birth control/ protection: Surgical     Other Topics Concern   ??? None     Social History Narrative       Review of Systems - History obtained from the patient  General ROS: negative for - chills, fatigue or fever  Psychological ROS: see above  Ophthalmic ROS: negative  ENT ROS: negative  Allergy and Immunology ROS: negative  Hematological and Lymphatic ROS: negative  Respiratory ROS: no cough, shortness of breath, or wheezing  Cardiovascular ROS: no chest pain or dyspnea on exertion  Gastrointestinal ROS: no abdominal pain, change  in bowel habits, or black or bloody stools  Genito-Urinary ROS: no dysuria, trouble voiding, or hematuria  Musculoskeletal ROS: see above  Neurological ROS: see above    Vitals:    03/23/16 1427   BP: 115/82   Temp: 98.7 ??F (37.1 ??C)   Weight: 196 lb 9.6 oz (89.2 kg)   Height: 5' 9.5" (1.765 m)         Physical Examination:   General appearance - alert, well appearing, and in no distress  Mental status - alert, oriented to person, place, and time  Eyes - pupils equal and reactive, extraocular eye movements intact  Neck - supple, no significant adenopathy  Lymphatics - no palpable lymphadenopathy, no hepatosplenomegaly  Chest - clear to auscultation, no wheezes, rales or rhonchi, symmetric air entry  Heart - normal rate, regular rhythm, normal S1, S2, no murmurs, rubs, clicks or gallops  Abdomen - soft, nontender, nondistended, no masses or organomegaly  Back exam - full range of motion, no tenderness, palpable spasm or pain on motion  Neurological - alert, oriented, normal speech, no focal findings or movement disorder noted  Musculoskeletal - rt hand- tender DIP joint index finger, no redness or swelling. Mild pain with motion.  Tinel is positive, Phalen's is negative.  No pain along finger or thumb  Extremities - peripheral pulses normal, no pedal edema, no clubbing or cyanosis  Skin - normal coloration and turgor, no rashes, no suspicious skin lesions noted    Assessment and Plan:    1. Migraine with aura and without status migrainosus, not intractable  Stable  Needs to reschedule Neurology   Refilled medication for headaches which is now lasting about 2 months    I have reviewed the patient???s controlled substance prescription history, as maintained in the [place] prescription monitoring program, so that the prescription(s) for a  controlled substance can be given.  - NEUROLOGY - BS Neurology  - codeine-butalbital-aspirin-caffeine (FIORINAL-CODEINE #3) 30-50-325-40 mg per capsule; Take 1 Cap by mouth every eight (8) hours as needed for Pain. Max Daily Amount: 3 Caps.  Dispense: 60 Cap; Refill: 0    2. Carpal tunnel syndrome of right wrist  New  May benefit from nerve conduction studies or eval by hand specialist  He is ok trying cold compresses, oral nsaid and wrist brace    3. Major depressive disorder with single episode, in full remission [facility])  Improving  Doing well on Effexor XR  Refilled medications today.   - venlafaxine-SR (EFFEXOR-XR) 75 mg capsule; Take 1 Cap by mouth daily.  Dispense: 30 Cap; Refill: 5    Morley Kos, MD

## 2016-04-12 ENCOUNTER — Other Ambulatory Visit: Payer: Self-pay | Admitting: Family Medicine

## 2016-04-12 DIAGNOSIS — G43809 Other migraine, not intractable, without status migrainosus: Secondary | ICD-10-CM

## 2016-04-12 MED ORDER — BUTALBITAL-ASA-CAFF-CODEINE 50-325-40-30 MG PO CAPS: 1.0000 | ORAL_CAPSULE | Freq: Three times a day (TID) | ORAL | 0 refills | Status: DC | PRN

## 2016-04-12 NOTE — Telephone Encounter (Signed)
Looks like this was done already

## 2016-04-12 NOTE — Telephone Encounter (Signed)
Refill request for Fiorinal w/Codeine Last filled by MD on - 03/03/16, #60x0 [OV with Dr. Laury AxonLowne] Last AEX - 12/29/15 [w/PCP] Last [3] BP: 116/80, 113/76, 118/77 Refill pending print per [facility] Forwarded to supervising physician in provider's absence.

## 2016-04-13 ENCOUNTER — Encounter: Payer: Self-pay | Admitting: Physician Assistant

## 2016-04-13 DIAGNOSIS — G43809 Other migraine, not intractable, without status migrainosus: Secondary | ICD-10-CM

## 2016-04-13 NOTE — Telephone Encounter (Signed)
Rx was done yesterday.     KP

## 2016-04-14 MED ORDER — BUTALBITAL-ASA-CAFF-CODEINE 50-325-40-30 MG PO CAPS: 1.0000 | ORAL_CAPSULE | Freq: Three times a day (TID) | ORAL | 0 refills | Status: DC | PRN

## 2016-04-14 MED FILL — BUTALBITAL COMP-CODEINE #3: 50-325-40-3 | 20 days supply | Qty: 60 | Fill #0

## 2016-05-09 ENCOUNTER — Encounter

## 2016-05-10 MED ORDER — CODEINE-BUTALBITAL-ASA-CAFFEINE 30 MG-50 MG-325 MG-40 MG CAP
30-50-325-40 mg | ORAL_CAPSULE | Freq: Three times a day (TID) | ORAL | 0 refills | Status: DC | PRN
Start: 2016-05-10 — End: 2018-06-04

## 2016-05-10 NOTE — Telephone Encounter (Signed)
From: Benjamin Pootaniel Tetro  To: [name], MD  Sent: 05/09/2016 11:06 PM EDT  Subject:  Medication Renewal Request    Original  authorizing provider: [name], MD    Benjamin Pootaniel  [name] would like a refill of the following medications:  codeine-butalbital-aspirin-caffeine  (FIORINAL-CODEINE #3) 30-50-325-40 mg per capsule Morley Kos[[name], MD]    Pref[facility]  pharmacy: [id number] - [place], [place] - [address] [facility] [address]    Comment:  I  have had more extreme migraine attacks this past 3-4 weeks, possibly due to the heat. I have had to take up to 2 doses a day several days.

## 2016-05-10 NOTE — Telephone Encounter (Signed)
Sent to patient through MYCH[employer]

## 2016-05-10 NOTE — Telephone Encounter (Signed)
"  I have had more extreme migraine attacks this past 3-4 weeks, possibly due to the heat. ??I have had to take up to 2 doses a day several days."

## 2016-05-10 NOTE — Telephone Encounter (Signed)
Rx ready for pick up  Please tell patient to check his email.    I have reviewed the patient???s controlled substance prescription history, as maintained in the [place] prescription monitoring program, so that the prescription(s) for a  controlled substance can be given.  Morley Kos, MD

## 2016-05-21 ENCOUNTER — Encounter: Payer: Self-pay | Admitting: Physician Assistant

## 2016-05-22 ENCOUNTER — Other Ambulatory Visit: Payer: Self-pay | Admitting: Physician Assistant

## 2016-05-22 DIAGNOSIS — IMO0002 Reserved for concepts with insufficient information to code with codable children: Secondary | ICD-10-CM

## 2016-05-22 DIAGNOSIS — G43709 Chronic migraine without aura, not intractable, without status migrainosus: Secondary | ICD-10-CM

## 2016-05-24 ENCOUNTER — Encounter: Payer: Self-pay | Admitting: Neurology

## 2016-05-24 ENCOUNTER — Ambulatory Visit (INDEPENDENT_AMBULATORY_CARE_PROVIDER_SITE_OTHER): Payer: 59 | Admitting: Neurology

## 2016-05-24 ENCOUNTER — Encounter: Payer: Self-pay | Admitting: Physician Assistant

## 2016-05-24 ENCOUNTER — Ambulatory Visit (INDEPENDENT_AMBULATORY_CARE_PROVIDER_SITE_OTHER): Payer: 59 | Admitting: Physician Assistant

## 2016-05-24 VITALS — BP 120/78 | HR 84 | Ht 69.0 in | Wt 192.0 lb

## 2016-05-24 DIAGNOSIS — Z23 Encounter for immunization: Secondary | ICD-10-CM

## 2016-05-24 DIAGNOSIS — G43709 Chronic migraine without aura, not intractable, without status migrainosus: Secondary | ICD-10-CM | POA: Diagnosis not present

## 2016-05-24 DIAGNOSIS — G43009 Migraine without aura, not intractable, without status migrainosus: Secondary | ICD-10-CM

## 2016-05-24 DIAGNOSIS — G4484 Primary exertional headache: Secondary | ICD-10-CM

## 2016-05-24 DIAGNOSIS — R51 Headache: Secondary | ICD-10-CM

## 2016-05-24 MED ORDER — ZOLMITRIPTAN 5 MG NA SOLN: NASAL | 0 refills | Status: DC

## 2016-05-24 MED ORDER — PROMETHAZINE HCL 12.5 MG PO TABS: 12.5000 mg | ORAL_TABLET | Freq: Four times a day (QID) | ORAL | 0 refills | Status: AC | PRN

## 2016-05-24 MED ORDER — VENLAFAXINE HCL ER 150 MG PO CP24: 150.0000 mg | ORAL_CAPSULE | Freq: Every day | ORAL | 2 refills | Status: DC

## 2016-05-24 MED ORDER — ZOLMITRIPTAN 5 MG NA SOLN: 1.0000 | NASAL | 0 refills | Status: DC | PRN

## 2016-05-24 MED FILL — PROMETHAZINE 12.5 MG TABLET: 12.5 | 8 days supply | Qty: 30 | Fill #0

## 2016-05-24 MED FILL — VENLAFAXINE HCL ER 150 MG C: 150 | 30 days supply | Qty: 30 | Fill #0

## 2016-05-24 NOTE — Patient Instructions (Signed)
Please start medications as directed.  Follow-up with Dr. Everlena CooperJaffe as scheduled. Make sure to call and schedule your MRI/MRA.  Follow-up with me when due for a complete physical

## 2016-05-24 NOTE — Progress Notes (Signed)
NEUROLOGY CONSULTATION NOTE  Bradley Richards MRN: [id number] DOB: 1965/11/10  Referring provider: Malva Cogan, PA-C Primary care provider: Malva Cogan, PA-C  Reason for consult:  headache  HISTORY OF PRESENT ILLNESS: Bradley Richards is a 50 year old male who presents for migraines.  History obtained by patient and PCP Note.  About 1.5 years ago, he began having mild to moderate bifrontal daily headache.  Then in December 2016, he had a new headache, described as severe 7/10 bifrontal pounding headache, associated with nausea, vomiting, dizziness, diaphoresis and phonophobia.  More recently, there is accompanying blurred vision.  It would last all day.  Over the past year, they have steadily become more frequent and intense, occurring next in March, then June, July, August and then 3 weeks later.  They started lasting into the next day.  He also reports headache on exertion, usually in the heat, such as playing with his children or during intercourse.  Past NSAIDS:  Ibuprofen, Toradol injection (in ED) Past analgesics:  Excedrin Migraine Past abortive triptans:  sumatriptan 50mg  (caused diarrhea) Past muscle relaxants:  no Past anti-emetic:  Phenergan injection (in ED) Past antihypertensive medications:  timolol Past antidepressant medications:  sertraline Past anticonvulsant medications:  topiramate 25mg  twice daily Past vitamins/Herbal/Supplements:  no  Current NSAIDS:  no Current analgesics:  Fiorinal with codeine, Excedrin Migraine Current triptans:  no Current anti-emetic:  no Current muscle relaxants:  no Current anti-anxiolytic:  no Current sleep aide:  no Current Antihypertensive medications:  no Current Antidepressant medications:  venlafaxine XR 75mg , amitriptyline 10mg  (not daily) Current Anticonvulsant medications:  no Current Vitamins/Herbal/Supplements:  no Current Antihistamines/Decongestants:  Flonase Other therapy:  no  Caffeine:  tea Alcohol:  no Smoker:   no Diet:  hydrates Exercise:  no Depression/stress:   stable Sleep hygiene:  varies Family history of headache:  No.  No prior history of headaxche  PAST MEDICAL HISTORY: Past Medical History:  Diagnosis Date  . Depression    no counseling  . Head ache     PAST SURGICAL HISTORY: Past Surgical History:  Procedure Laterality Date  . NO PAST SURGERIES  12/11/2011    MEDICATIONS: Current Outpatient Prescriptions on File Prior to Visit  Medication Sig Dispense Refill  . amitriptyline (ELAVIL) 25 MG tablet Take 1 tablet (25 mg total) by mouth at bedtime. 30 tablet 3  . Aspirin-Acetaminophen-Caffeine (EXCEDRIN MIGRAINE PO) Take by mouth as needed.     . butalbital-aspirin-caffeine-codeine (FIORINAL WITH CODEINE) 50-325-40-30 MG capsule Take 1 capsule by mouth every 8 (eight) hours as needed for migraine. 60 capsule 0  . fluticasone (FLONASE) 50 MCG/ACT nasal spray Place 2 sprays into the nose daily as needed for rhinitis. 16 g 3  . ibuprofen (ADVIL,MOTRIN) 200 MG tablet Take 200 mg by mouth every 6 (six) hours as needed for pain.    . mometasone (ELOCON) 0.1 % lotion APP EXT TO THE SCALP  AS NEEDED  5   No current facility-administered medications on file prior to visit.     ALLERGIES: No Known Allergies  FAMILY HISTORY: Family History  Problem Relation Age of Onset  . Stroke Mother 19  . Hypothyroidism Mother   . Heart disease Father     stent & bypass  . Alcohol abuse      maternal & paternal grandfather, & sister  . Prostate cancer Paternal Uncle   . Multiple sclerosis Brother   . Hypertension Neg Hx   . Diabetes Neg Hx   . Colon cancer Neg  Hx     SOCIAL HISTORY: Social History   Social History  . Marital status: Married    Spouse name: N/A  . Number of children: N/A  . Years of education: N/A   Occupational History  . Not on file.   Social History Main Topics  . Smoking status: Never Smoker  . Smokeless tobacco: Never Used  . Alcohol use No  . Drug  use: No  . Sexual activity: Not on file   Other Topics Concern  . Not on file   Social History Narrative  . No narrative on file    REVIEW OF SYSTEMS: Constitutional: No fevers, chills, or sweats, no generalized fatigue, change in appetite Eyes: No visual changes, double vision, eye pain Ear, nose and throat: No hearing loss, ear pain, nasal congestion, sore throat Cardiovascular: No chest pain, palpitations Respiratory:  No shortness of breath at rest or with exertion, wheezes GastrointestinaI: No nausea, vomiting, diarrhea, abdominal pain, fecal incontinence Genitourinary:  No dysuria, urinary retention or frequency Musculoskeletal:  No neck pain, back pain Integumentary: No rash, pruritus, skin lesions Neurological: as above Psychiatric: No depression, insomnia, anxiety Endocrine: No palpitations, fatigue, diaphoresis, mood swings, change in appetite, change in weight, increased thirst Hematologic/Lymphatic:  No purpura, petechiae. Allergic/Immunologic: no itchy/runny eyes, nasal congestion, recent allergic reactions, rashes  PHYSICAL EXAM: Vitals:   05/24/16 1313  BP: 120/78  Pulse: 84   General: No acute distress.  Patient appears well-groomed.  Head:  Normocephalic/atraumatic Eyes:  fundi examined but not visualized Neck: supple, no paraspinal tenderness, full range of motion Back: No paraspinal tenderness Heart: regular rate and rhythm Lungs: Clear to auscultation bilaterally. Vascular: No carotid bruits. Neurological Exam: Mental status: alert and oriented to person, place, and time, recent and remote memory intact, fund of knowledge intact, attention and concentration intact, speech fluent and not dysarthric, language intact. Cranial nerves: CN I: not tested CN II: pupils equal, round and reactive to light, visual fields intact CN III, IV, VI:  full range of motion, no nystagmus, no ptosis CN V: facial sensation intact CN VII: upper and lower face symmetric CN  VIII: hearing intact CN IX, X: gag intact, uvula midline CN XI: sternocleidomastoid and trapezius muscles intact CN XII: tongue midline Bulk & Tone: normal, no fasciculations. Motor:  5/5 throughout  Sensation: temperature and vibration sensation intact. Deep Tendon Reflexes:  2+ throughout, toes downgoing.  Finger to nose testing:  Without dysmetria.  Heel to shin:  Without dysmetria.  Gait:  Normal station and stride.  Able to turn and tandem walk. Romberg negative.  IMPRESSION: Probable migraines with increasing frequency and intensity. He also endorses tension-type headache and exertional headache  Due to the exertional headache as well as worsening frequency and intensity, further workup is warranted.  PLAN: 1.  MRI and MRA of head 2.  Increase venlafaxine XR to 150mg  daily.  He will stop amitriptyline. 3.  Zomig 5mg  NS and promethazine 12.5mg  for abortive therapy and nausea.  Use Fiorinal as last resort 4.  Follow up in 3 months but contact us in 4 weeks with update.  Thank you for allowing me to take part in the care of this patient.  Shon MilletAdam Jasmeet Manton, DO  CC:  Waldon MerlWilliam C. Martin, PA-C

## 2016-05-24 NOTE — Progress Notes (Signed)
   Patient presents to clinic today to discuss medication changes for chronic migraines. Patient had new patient appointment today with Neurology for uncontrolled migraines. Patient was instructed to discontinue Elavil and begin increased dose of Effexor XR. Patient would like to discuss potential side effects of increased dose of Effexor XR. States he did not discuss with specialist. Is scheduled to have MRI/MRA of the brain to r/o any abnormalities that could be contributing to headaches.  Past Medical History:  Diagnosis Date  . Depression    no counseling  . Head ache     Current Outpatient Prescriptions on File Prior to Visit  Medication Sig Dispense Refill  . amitriptyline (ELAVIL) 25 MG tablet Take 1 tablet (25 mg total) by mouth at bedtime. 30 tablet 3  . Aspirin-Acetaminophen-Caffeine (EXCEDRIN MIGRAINE PO) Take by mouth as needed.     . butalbital-aspirin-caffeine-codeine (FIORINAL WITH CODEINE) 50-325-40-30 MG capsule Take 1 capsule by mouth every 8 (eight) hours as needed for migraine. 60 capsule 0  . fluticasone (FLONASE) 50 MCG/ACT nasal spray Place 2 sprays into the nose daily as needed for rhinitis. 16 g 3  . ibuprofen (ADVIL,MOTRIN) 200 MG tablet Take 200 mg by mouth every 6 (six) hours as needed for pain.    . mometasone (ELOCON) 0.1 % lotion APP EXT TO THE SCALP  AS NEEDED  5   No current facility-administered medications on file prior to visit.     No Known Allergies  Family History  Problem Relation Age of Onset  . Stroke Mother 6570  . Hypothyroidism Mother   . Heart disease Father     stent & bypass  . Alcohol abuse      maternal & paternal grandfather, & sister  . Prostate cancer Paternal Uncle   . Multiple sclerosis Brother   . Hypertension Neg Hx   . Diabetes Neg Hx   . Colon cancer Neg Hx     Social History   Social History  . Marital status: Married    Spouse name: N/A  . Number of children: N/A  . Years of education: N/A   Social History Main  Topics  . Smoking status: Never Smoker  . Smokeless tobacco: Never Used  . Alcohol use No  . Drug use: No  . Sexual activity: Not Asked   Other Topics Concern  . None   Social History Narrative  . None    Review of Systems - See HPI.  All other ROS are negative.  BP 118/76 (BP Location: Left Arm, Patient Position: Sitting, Cuff Size: Large)   Pulse 82   Temp 98.2 F (36.8 C) (Oral)   Resp 16   Ht 5\' 9"  (1.753 m)   Wt 192 lb (87.1 kg)   SpO2 97%   BMI 28.35 kg/m   Physical Exam  Constitutional: He is well-developed, well-nourished, and in no distress.  HENT:  Head: Normocephalic and atraumatic.  Eyes: Conjunctivae are normal.  Cardiovascular: Normal rate and regular rhythm.   Pulmonary/Chest: Effort normal.  Vitals reviewed.  Assessment/Plan: Migraines Reviewed potential side effects of medication with patient. Discussed that side effect potential statistically increased with higher doses of medication, but that does not mean he will experience any of them. Has so far tolerated the Effexor well. Recommend he increase dose as recommended by specialist.     Piedad ClimesMartin, Alizey Noren Cody, PA-C

## 2016-05-24 NOTE — Patient Instructions (Signed)
Migraine Recommendations: 1.  Stop amitriptyline.  We will increase venlafaxine XR to 150mg  daily.  Call in 5 weeks with update and we can adjust dose if needed. 2.  Take Zomig 5mg  nasal spray, one spray in one nostril at earliest onset of headache.  May repeat dose once in 2 hours if needed.  Do not exceed two sprays in 24 hours.  For nausea, may take promethazine 12.5mg  every 6 hours as needed. 3.  Limit use of pain relievers to no more than 2 days out of the week.  These medications include acetaminophen, ibuprofen, triptans and narcotics.  This will help reduce risk of rebound headaches. 4.  Be aware of common food triggers such as processed sweets, processed foods with nitrites (such as deli meat, hot dogs, sausages), foods with MSG, alcohol (such as wine), chocolate, certain cheeses, certain fruits (dried fruits, some citrus fruit), vinegar, diet soda. 4.  Avoid caffeine 5.  Routine exercise 6.  Proper sleep hygiene 7.  Stay adequately hydrated with water 8.  Keep a headache diary. 9.  Maintain proper stress management. 10.  Do not skip meals. 11.  Consider supplements:  Magnesium oxide 400mg  to 600mg  daily, riboflavin 400mg , Coenzyme Q 10 100mg  three times daily 12.  We will check MRI of brain and MRA of head to further evaluate headache. 13.  Follow up in 3 months but contact me in 4-5 weeks with update.

## 2016-05-24 NOTE — Progress Notes (Signed)
Pre visit review using our clinic review tool, if applicable. No additional management support is needed unless otherwise documented below in the visit note/SLS  

## 2016-05-25 NOTE — Assessment & Plan Note (Signed)
Reviewed potential side effects of medication with patient. Discussed that side effect potential statistically increased with higher doses of medication, but that does not mean he will experience any of them. Has so far tolerated the Effexor well. Recommend he increase dose as recommended by specialist.

## 2016-05-26 DIAGNOSIS — Z23 Encounter for immunization: Secondary | ICD-10-CM | POA: Diagnosis not present

## 2016-06-05 ENCOUNTER — Other Ambulatory Visit: Payer: 59

## 2016-06-05 ENCOUNTER — Inpatient Hospital Stay: Admission: RE | Admit: 2016-06-05 | Payer: 59 | Source: Ambulatory Visit

## 2016-06-11 ENCOUNTER — Other Ambulatory Visit: Payer: Self-pay | Admitting: Physician Assistant

## 2016-06-11 DIAGNOSIS — G43809 Other migraine, not intractable, without status migrainosus: Secondary | ICD-10-CM

## 2016-06-12 ENCOUNTER — Other Ambulatory Visit: Payer: Self-pay | Admitting: Physician Assistant

## 2016-06-12 MED ORDER — BUTALBITAL-ASA-CAFF-CODEINE 50-325-40-30 MG PO CAPS: 1.0000 | ORAL_CAPSULE | Freq: Three times a day (TID) | ORAL | 0 refills | Status: DC | PRN

## 2016-06-13 MED FILL — BUTALBITAL COMP-CODEINE #3: 50-325-40-3 | 20 days supply | Qty: 60 | Fill #0

## 2016-07-10 ENCOUNTER — Encounter: Payer: Self-pay | Admitting: Neurology

## 2016-07-10 ENCOUNTER — Encounter: Payer: Self-pay | Admitting: Physician Assistant

## 2016-07-10 MED ORDER — VENLAFAXINE HCL ER 150 MG PO CP24: 150.0000 mg | ORAL_CAPSULE | Freq: Every day | ORAL | 2 refills | Status: DC

## 2016-08-08 ENCOUNTER — Other Ambulatory Visit: Payer: Self-pay | Admitting: Physician Assistant

## 2016-08-09 MED ORDER — BUTALBITAL-ASA-CAFF-CODEINE 50-325-40-30 MG PO CAPS: 1.0000 | ORAL_CAPSULE | Freq: Three times a day (TID) | ORAL | 0 refills | Status: DC | PRN

## 2016-08-09 NOTE — Telephone Encounter (Signed)
Last OV 05/24/16 Fiorinal last filled 06/12/16

## 2016-08-20 ENCOUNTER — Encounter: Payer: Self-pay | Admitting: Neurology

## 2016-08-24 ENCOUNTER — Ambulatory Visit: Payer: 59 | Admitting: Neurology

## 2016-08-29 ENCOUNTER — Ambulatory Visit: Payer: 59 | Admitting: Neurology

## 2016-08-30 ENCOUNTER — Telehealth: Payer: Self-pay | Admitting: Neurology

## 2016-08-30 NOTE — Telephone Encounter (Signed)
Patient wife Amy would like to talk to someone about the MRI. They found a place that will do them cheaper but wants to ask some question please call [phone]

## 2016-08-31 ENCOUNTER — Telehealth: Payer: Self-pay | Admitting: Neurology

## 2016-08-31 NOTE — Telephone Encounter (Signed)
Spoke with patient's wife who wanted to make sure that the MRI and MRA he needed didn't need any specific specifications. [place] Made them aware that no special equipment is needed and let us know where to send the order.

## 2016-08-31 NOTE — Telephone Encounter (Signed)
Order faxed to number provided with confirmation received. Patient's wife made aware.

## 2016-08-31 NOTE — Telephone Encounter (Signed)
Sherre PootDaniel Lathon September 08, 2065. His wife Amy called to see if [facility] ? The fax # is [phone] and Amy's # is [phone]. Thank you

## 2016-09-17 ENCOUNTER — Encounter: Payer: Self-pay | Admitting: Neurology

## 2016-09-17 ENCOUNTER — Encounter: Payer: Self-pay | Admitting: Family Medicine

## 2016-09-18 ENCOUNTER — Telehealth: Payer: Self-pay | Admitting: Physician Assistant

## 2016-09-18 ENCOUNTER — Other Ambulatory Visit: Payer: Self-pay

## 2016-09-18 MED ORDER — BUTALBITAL-ASA-CAFF-CODEINE 50-325-40-30 MG PO CAPS
1.0000 | ORAL_CAPSULE | Freq: Three times a day (TID) | ORAL | 0 refills | Status: DC | PRN
Start: 2016-09-18 — End: 2016-10-17

## 2016-09-18 NOTE — Telephone Encounter (Signed)
Patient scheduled for 10/25/16

## 2016-09-18 NOTE — Telephone Encounter (Signed)
Last filled 08/09/16 #20 Last ov: 05/24/16 Neurology visit 05/24/16 Please advise

## 2016-09-18 NOTE — Telephone Encounter (Signed)
Ok with me 

## 2016-09-18 NOTE — Telephone Encounter (Signed)
OK 

## 2016-09-18 NOTE — Telephone Encounter (Signed)
Called patient and replied to MyChart Rx request:  Phoned-in butalbital-aspirin-caffeine-codeine (FIORINAL WITH CODEINE) 50-325-40-30 MG capsule . This is to prevent rebound headache and he should be only using the Fiorinal as last resort per Dr. Everlena CooperJaffe. Patient verbalized understanding.

## 2016-09-18 NOTE — Telephone Encounter (Signed)
As discussed at time of last fill, Neurology will need to take this over.  No further refills from me for this medication as I am not managing migraines.

## 2016-09-18 NOTE — Telephone Encounter (Signed)
[facility] Dr[facility] Carmelia RollerWendling due to [facility] recent [place] [facility] [place]. Please advise  Phone: [phone]

## 2016-09-18 NOTE — Telephone Encounter (Signed)
Patient is requesting a refill of butalbital-aspirin-caffeine-codeine (FIORINAL WITH CODEINE) 50-325-40-30 MG capsule Please advise   Pharmacy: [facility] [id number] - [place], [place] - [address]

## 2016-09-19 NOTE — Telephone Encounter (Signed)
Advised patient medication will be prescribed by Neurology. Patient agreeable.

## 2016-10-09 ENCOUNTER — Encounter: Payer: Self-pay | Admitting: Neurology

## 2016-10-12 ENCOUNTER — Other Ambulatory Visit: Payer: Self-pay | Admitting: Neurology

## 2016-10-17 ENCOUNTER — Other Ambulatory Visit: Payer: Self-pay | Admitting: Neurology

## 2016-10-18 MED ORDER — BUTALBITAL-ASA-CAFF-CODEINE 50-325-40-30 MG PO CAPS: 1.0000 | ORAL_CAPSULE | Freq: Three times a day (TID) | ORAL | 0 refills | Status: DC | PRN

## 2016-10-20 ENCOUNTER — Encounter: Payer: Self-pay | Admitting: Neurology

## 2016-10-24 ENCOUNTER — Encounter: Payer: Self-pay | Admitting: Neurology

## 2016-10-24 ENCOUNTER — Other Ambulatory Visit: Payer: Self-pay | Admitting: *Deleted

## 2016-10-24 ENCOUNTER — Ambulatory Visit (INDEPENDENT_AMBULATORY_CARE_PROVIDER_SITE_OTHER): Payer: 59 | Admitting: Neurology

## 2016-10-24 VITALS — BP 110/80 | HR 89 | Ht 69.0 in | Wt 196.5 lb

## 2016-10-24 DIAGNOSIS — G4484 Primary exertional headache: Secondary | ICD-10-CM

## 2016-10-24 DIAGNOSIS — G43009 Migraine without aura, not intractable, without status migrainosus: Secondary | ICD-10-CM

## 2016-10-24 DIAGNOSIS — G43909 Migraine, unspecified, not intractable, without status migrainosus: Secondary | ICD-10-CM

## 2016-10-24 MED ORDER — INDOMETHACIN 25 MG PO CAPS: 25.0000 mg | ORAL_CAPSULE | Freq: Three times a day (TID) | ORAL | 0 refills | Status: DC | PRN

## 2016-10-24 NOTE — Progress Notes (Signed)
NEUROLOGY FOLLOW UP OFFICE NOTE  Bradley Richards [id number]  HISTORY OF PRESENT ILLNESS: Bradley Richards is a 51 year old male who follows up for migraine and primary exertional headache.   UPDATE: Migraines improved.  He no longer has severe nausea and vomiting. Intensity:  7/10 Duration:  2 hours  Frequency:  3 to 4 days a month For abortive management, takes Zomig 5mg  NS, Goodys or Fiorinal (last resort), which are effective. He still has mild daily headaches, 2-3/10, lasting 5 hours.  He takes Goodys 3 to 4 days a week. He still has exertional headaches with exercise, sports and coitus most of the time.  He will try taking a Fiorinal prior to activity.  Current NSAIDS:  no Current analgesics:  Fiorinal with codeine (last resort), Excedrin Migraine, Goodys Current triptans:  Zomig 5mg  NS Current anti-emetic:  promethazine 12.5mg  Current muscle relaxants:  no Current anti-anxiolytic:  no Current sleep aide:  no Current Antihypertensive medications:  no Current Antidepressant medications:  venlafaxine XR 150mg , amitriptyline 25mg  (2 times a month to help sleep) Current Anticonvulsant medications:  no Current Vitamins/Herbal/Supplements:  no Current Antihistamines/Decongestants:  Flonase Other therapy:  no  To evaluate exertional headaches, MRI of brain without contrast was performed on 10/09/16, which was negative. The MRA of head was not performed.   Caffeine:  tea Alcohol:  no Smoker:  no Diet:  hydrates Exercise:  Walks.  Plays sports with his children Depression/stress:   stable Sleep hygiene:  varies Family history of headache:  No.  HISTORY: In early-mid 2016, he began having mild to moderate bifrontal daily headache.  Then in December 2016, he had a new headache, described as severe 7/10 bifrontal pounding headache, associated with nausea, vomiting, dizziness, diaphoresis and phonophobia.  More recently, there is accompanying blurred vision.  It would last all day.   Over the past year, they have steadily become more frequent and intense, occurring next in March, then June, July, August and then 3 weeks later.  They started lasting into the next day.  Triggers include hunger, lack of sleep and exertion. Drinking lots of water helps.    He also reports headache on exertion, usually in the heat, such as playing with his children or during intercourse.   Past NSAIDS:  Ibuprofen, Toradol injection (in ED) Past analgesics:  Excedrin Migraine Past abortive triptans:  sumatriptan 50mg  (caused diarrhea) Past muscle relaxants:  no Past anti-emetic:  Phenergan injection (in ED) Past antihypertensive medications:  timolol Past antidepressant medications:  sertraline Past anticonvulsant medications:  topiramate 25mg  twice daily Past vitamins/Herbal/Supplements:  no   No prior history of headaxche  PAST MEDICAL HISTORY: Past Medical History:  Diagnosis Date  . Depression    no counseling  . Head ache     MEDICATIONS: Current Outpatient Prescriptions on File Prior to Visit  Medication Sig Dispense Refill  . amitriptyline (ELAVIL) 25 MG tablet Take 1 tablet (25 mg total) by mouth at bedtime. 30 tablet 3  . Aspirin-Acetaminophen-Caffeine (EXCEDRIN MIGRAINE PO) Take by mouth as needed.     . butalbital-aspirin-caffeine-codeine (FIORINAL WITH CODEINE) 50-325-40-30 MG capsule Take 1 capsule by mouth every 8 (eight) hours as needed for migraine. 10 capsule 0  . fluticasone (FLONASE) 50 MCG/ACT nasal spray Place 2 sprays into the nose daily as needed for rhinitis. 16 g 3  . ibuprofen (ADVIL,MOTRIN) 200 MG tablet Take 200 mg by mouth every 6 (six) hours as needed for pain.    . mometasone (ELOCON) 0.1 % lotion  APP EXT TO THE SCALP  AS NEEDED  5  . promethazine (PHENERGAN) 12.5 MG tablet Take 1 tablet (12.5 mg total) by mouth every 6 (six) hours as needed for nausea or vomiting. 30 tablet 0  . venlafaxine XR (EFFEXOR-XR) 150 MG 24 hr capsule TAKE 1 CAPSULE(150 MG) BY  MOUTH DAILY WITH BREAKFAST 30 capsule 2  . zolmitriptan (ZOMIG) 5 MG nasal solution 1 spray at earliest onset of headache.  May repeat once in 2 hours if needed.  Do not exceed 2 sprays in 24 hrs. 6 Units 0   No current facility-administered medications on file prior to visit.     ALLERGIES: No Known Allergies  FAMILY HISTORY: Family History  Problem Relation Age of Onset  . Stroke Mother 5  . Hypothyroidism Mother   . Heart disease Father     stent & bypass  . Alcohol abuse      maternal & paternal grandfather, & sister  . Prostate cancer Paternal Uncle   . Multiple sclerosis Brother   . Hypertension Neg Hx   . Diabetes Neg Hx   . Colon cancer Neg Hx     SOCIAL HISTORY: Social History   Social History  . Marital status: Married    Spouse name: N/A  . Number of children: N/A  . Years of education: N/A   Occupational History  . Not on file.   Social History Main Topics  . Smoking status: Never Smoker  . Smokeless tobacco: Never Used  . Alcohol use No  . Drug use: No  . Sexual activity: Not on file   Other Topics Concern  . Not on file   Social History Narrative  . No narrative on file    REVIEW OF SYSTEMS: Constitutional: No fevers, chills, or sweats, no generalized fatigue, change in appetite Eyes: No visual changes, double vision, eye pain Ear, nose and throat: No hearing loss, ear pain, nasal congestion, sore throat Cardiovascular: No chest pain, palpitations Respiratory:  No shortness of breath at rest or with exertion, wheezes GastrointestinaI: No nausea, vomiting, diarrhea, abdominal pain, fecal incontinence Genitourinary:  No dysuria, urinary retention or frequency Musculoskeletal:  No neck pain, back pain Integumentary: No rash, pruritus, skin lesions Neurological: as above Psychiatric: No depression, insomnia, anxiety Endocrine: No palpitations, fatigue, diaphoresis, mood swings, change in appetite, change in weight, increased  thirst Hematologic/Lymphatic:  No purpura, petechiae. Allergic/Immunologic: no itchy/runny eyes, nasal congestion, recent allergic reactions, rashes  PHYSICAL EXAM: Vitals:   10/24/16 0737  BP: 110/80  Pulse: 89   General: No acute distress.  Patient appears well-groomed.  normal body habitus. Head:  Normocephalic/atraumatic Eyes:  Fundi examined but not visualized Neck: supple, no paraspinal tenderness, full range of motion Heart:  Regular rate and rhythm Lungs:  Clear to auscultation bilaterally Back: No paraspinal tenderness Neurological Exam: alert and oriented to person, place, and time. Attention span and concentration intact, recent and remote memory intact, fund of knowledge intact.  Speech fluent and not dysarthric, language intact.  CN II-XII intact. Bulk and tone normal, muscle strength 5/5 throughout.  Sensation to light touch  intact.  Deep tendon reflexes 2+ throughout.  Finger to nose testing intact.  Gait normal  IMPRESSION: Migraine without aura Primary exertional headache  PLAN: 1.  Continue venlafaxine XR 150mg  daily.  Start taking amitriptyline 25mg  at bedtime (everynight).  2.  Use the Zomig, Excedrin or Fiorinal (last resort) for migraines (limit use to no more than 2 days out of the week) 3.  Limit use of pain relievers to no more than 2 days out of the week.  These medications include acetaminophen, ibuprofen, triptans and narcotics.  This will help reduce risk of rebound headaches. 4.  Take indomethacin 25mg  60 minutes prior to activity (exercise, coitus, etc) 5.  Be aware of common food triggers such as processed sweets, processed foods with nitrites (such as deli meat, hot dogs, sausages), foods with MSG, alcohol (such as wine), chocolate, certain cheeses, certain fruits (dried fruits, some citrus fruit), vinegar, diet soda. 6.  Avoid caffeine 7.  Routine exercise 8.  Proper sleep hygiene 9.  Stay adequately hydrated with water 10.  Keep a headache  diary. 11.  Maintain proper stress management. 12.  Do not skip meals. 13.  Consider supplements:  Magnesium citrate 400mg  to 600mg  daily, riboflavin 400mg , Coenzyme Q 10 100mg  three times daily 14.  We will still need to get MRA of head to evaluate for any vascular abnormality such as aneurysm. 15.  Follow up in 4 months.  Shon MilletAdam Dariah Mcsorley, DO  CC:  Waldon MerlWilliam C. Martin, PA-C

## 2016-10-24 NOTE — Patient Instructions (Addendum)
Migraine Recommendations: 1.  Continue venlafaxine XR 150mg  daily.  Start taking amitriptyline 25mg  at bedtime (everynight).  Contact me in 4 weeks and we can increase dose of amitriptyline if needed. 2.  Use the Zomig, Excedrin or Fiorinal (last resort) for migraines (limit use to no more than 2 days out of the week) 3.  Limit use of pain relievers to no more than 2 days out of the week.  These medications include acetaminophen, ibuprofen, triptans and narcotics.  This will help reduce risk of rebound headaches. 4.  Take indomethacin 25mg  60 minutes prior to activity (exercise, coitus, etc) 5.  Be aware of common food triggers such as processed sweets, processed foods with nitrites (such as deli meat, hot dogs, sausages), foods with MSG, alcohol (such as wine), chocolate, certain cheeses, certain fruits (dried fruits, some citrus fruit), vinegar, diet soda. 6.  Avoid caffeine 7.  Routine exercise 8.  Proper sleep hygiene 9.  Stay adequately hydrated with water 10.  Keep a headache diary. 11.  Maintain proper stress management. 12.  Do not skip meals. 13.  Consider supplements:  Magnesium citrate 400mg  to 600mg  daily, riboflavin 400mg , Coenzyme Q 10 100mg  three times daily 14.  We will still need to get MRA of head. 15.  Follow up in 4 months.

## 2016-10-25 ENCOUNTER — Encounter: Payer: Self-pay | Admitting: Family Medicine

## 2016-10-25 ENCOUNTER — Ambulatory Visit (INDEPENDENT_AMBULATORY_CARE_PROVIDER_SITE_OTHER): Payer: 59 | Admitting: Family Medicine

## 2016-10-25 VITALS — BP 119/79 | HR 94 | Temp 98.7°F | Ht 69.0 in | Wt 193.8 lb

## 2016-10-25 DIAGNOSIS — Z0000 Encounter for general adult medical examination without abnormal findings: Secondary | ICD-10-CM | POA: Diagnosis not present

## 2016-10-25 DIAGNOSIS — Z13220 Encounter for screening for lipoid disorders: Secondary | ICD-10-CM | POA: Diagnosis not present

## 2016-10-25 DIAGNOSIS — Z114 Encounter for screening for human immunodeficiency virus [HIV]: Secondary | ICD-10-CM

## 2016-10-25 LAB — COMPREHENSIVE METABOLIC PANEL
ALT: 23 U/L (ref 0–53)
AST: 16 U/L (ref 0–37)
Albumin: 4.4 g/dL (ref 3.5–5.2)
Alkaline Phosphatase: 77 U/L (ref 39–117)
BILIRUBIN TOTAL: 0.3 mg/dL (ref 0.2–1.2)
BUN: 11 mg/dL (ref 6–23)
CO2: 29 meq/L (ref 19–32)
CREATININE: 1.02 mg/dL (ref 0.40–1.50)
Calcium: 9.3 mg/dL (ref 8.4–10.5)
Chloride: 105 mEq/L (ref 96–112)
GFR: 82.7 mL/min (ref >60.00)
Glucose, Bld: 108 mg/dL — ABNORMAL HIGH (ref 70–99)
Potassium: 4 mEq/L (ref 3.5–5.1)
SODIUM: 139 meq/L (ref 135–145)
Total Protein: 6.8 g/dL (ref 6.0–8.3)

## 2016-10-25 LAB — CBC
HCT: 43.5 % (ref 39.0–52.0)
Hemoglobin: 14.9 g/dL (ref 13.0–17.0)
MCHC: 34.2 g/dL (ref 30.0–36.0)
MCV: 92.1 fl (ref 78.0–100.0)
Platelets: 235 10*3/uL (ref 150.0–400.0)
RBC: 4.72 Mil/uL (ref 4.22–5.81)
RDW: 13.3 % (ref 11.5–15.5)
WBC: 6.3 10*3/uL (ref 4.0–10.5)

## 2016-10-25 LAB — HEMOGLOBIN A1C: Hgb A1c MFr Bld: 5.5 % (ref 4.6–6.5)

## 2016-10-25 LAB — LIPID PANEL
Cholesterol: 188 mg/dL (ref 0–200)
HDL: 42.8 mg/dL (ref >39.00)
LDL CALC: 114 mg/dL — AB (ref 0–99)
NonHDL: 145.46
TRIGLYCERIDES: 157 mg/dL — AB (ref 0.0–149.0)
Total CHOL/HDL Ratio: 4
VLDL: 31.4 mg/dL (ref 0.0–40.0)

## 2016-10-25 LAB — HIV ANTIBODY (ROUTINE TESTING W REFLEX): HIV: NONREACTIVE

## 2016-10-25 NOTE — Progress Notes (Signed)
Pre visit review using our clinic review tool, if applicable. No additional management support is needed unless otherwise documented below in the visit note. 

## 2016-10-25 NOTE — Progress Notes (Signed)
Chief Complaint  Patient presents with  . Transitions Of Care    Well Male Bradley Richards is here for a complete physical.   His last physical was >1 year ago.  Current diet: in general, a "healthy" diet   Current exercise: walking, football and basketball with his kids  Weight trend: stable Does pt snore? No.  Daytime fatigue? No. Seat belt? Yes.     Past Medical History:  Diagnosis Date  . Depression    no counseling  . Head ache     Past Surgical History:  Procedure Laterality Date  . NO PAST SURGERIES  12/11/2011   Medications  Current Outpatient Prescriptions on File Prior to Visit  Medication Sig Dispense Refill  . amitriptyline (ELAVIL) 25 MG tablet Take 1 tablet (25 mg total) by mouth at bedtime. 30 tablet 3  . Aspirin-Acetaminophen-Caffeine (EXCEDRIN MIGRAINE PO) Take by mouth as needed.     . butalbital-aspirin-caffeine-codeine (FIORINAL WITH CODEINE) 50-325-40-30 MG capsule Take 1 capsule by mouth every 8 (eight) hours as needed for migraine. 10 capsule 0  . fluticasone (FLONASE) 50 MCG/ACT nasal spray Place 2 sprays into the nose daily as needed for rhinitis. 16 g 3  . ibuprofen (ADVIL,MOTRIN) 200 MG tablet Take 200 mg by mouth every 6 (six) hours as needed for pain.    . indomethacin (INDOCIN) 25 MG capsule Take 1 capsule (25 mg total) by mouth 3 (three) times daily as needed. 30 capsule 0  . mometasone (ELOCON) 0.1 % lotion APP EXT TO THE SCALP  AS NEEDED  5  . promethazine (PHENERGAN) 12.5 MG tablet Take 1 tablet (12.5 mg total) by mouth every 6 (six) hours as needed for nausea or vomiting. 30 tablet 0  . venlafaxine XR (EFFEXOR-XR) 150 MG 24 hr capsule TAKE 1 CAPSULE(150 MG) BY MOUTH DAILY WITH BREAKFAST 30 capsule 2   Allergies No Known Allergies Family History Family History  Problem Relation Age of Onset  . Stroke Mother [id number]  . Hypothyroidism Mother   . Heart disease Father     stent & bypass  . Alcohol abuse      maternal & paternal grandfather, &  sister  . Prostate cancer Paternal Uncle   . Multiple sclerosis Brother   . Hypertension Neg Hx   . Diabetes Neg Hx   . Colon cancer Neg Hx     Review of Systems: Constitutional:  no unexpected change in weight, no fevers or chills Eye:  no recent significant change in vision Ear/Nose/Mouth/Throat:  Ears:  no tinnitus or hearing loss Nose/Mouth/Throat:  no complaints of nasal congestion or bleeding, no sore throat and oral sores Cardiovascular:  no chest pain, no palpitations Respiratory:  no cough and no shortness of breath Gastrointestinal:  no abdominal pain, no change in bowel habits, no nausea, vomiting, diarrhea, or constipation and no black or bloody stool GU:  Male: negative for dysuria, frequency, and incontinence and negative for prostate symptoms Musculoskeletal/Extremities:  no pain, redness, or swelling of the joints Integumentary (Skin/Breast):  no abnormal skin lesions reported Neurologic:  +headaches, no numbness, tingling Endocrine:  weight changes, masses in the neck, heat/cold intolerance, bowel or skin changes, or cardiovascular system symptoms Hematologic/Lymphatic:  no abnormal bleeding, no HIV risk factors, no night sweats, no swollen nodes, no weight loss  Exam BP 119/79 (BP Location: Left Arm, Patient Position: Sitting, Cuff Size: Small)   Pulse 94   Temp 98.7 F (37.1 C) (Oral)   Ht 5\' 9"  (1.753 m)  Wt 193 lb 12.8 oz (87.9 kg)   SpO2 98%   BMI 28.62 kg/m  General:  well developed, well nourished, in no apparent distress Skin:  no significant moles, warts, or growths Head:  no masses, lesions, or tenderness Eyes:  pupils equal and round, sclera anicteric without injection Ears:  canals without lesions, TMs shiny without retraction, no obvious effusion, no erythema Nose:  nares patent, septum midline, mucosa normal Throat/Pharynx:  lips and gingiva without lesion; tongue and uvula midline; non-inflamed pharynx; no exudates or postnasal drainage Neck:  neck supple without adenopathy, thyromegaly, or masses Lungs:  clear to auscultation, breath sounds equal bilaterally, no respiratory distress Cardio:  regular rate and rhythm without murmurs, heart sounds without clicks or rubs Abdomen:  abdomen soft, nontender; bowel sounds normal; no masses or organomegaly Genital (male): circumcised penis, no lesions or discharge; testes present bilaterally without masses or tenderness Rectal: Deferred Musculoskeletal:  symmetrical muscle groups noted without atrophy or deformity, L arm anatomically longer than R Extremities:  no clubbing, cyanosis, or edema, no deformities, no skin discoloration Neuro:  gait normal; deep tendon reflexes normal and symmetric Psych: well oriented with normal range of affect and appropriate judgment/insight  Assessment and Plan  Well adult exam - Plan: Comprehensive metabolic panel, Hemoglobin A1c, CBC  Encounter for screening for HIV - Plan: HIV antibody  Screening cholesterol level - Plan: Lipid panel   Well 51 y.o. male. Counseled on diet and exercise. Discussed risks and benefits of prostate cancer screening. Will start screening at age 30 with PSA. Immunizations, labs, and further orders as above. Follow up in 1 year pending the above workup. The patient voiced understanding and agreement to the plan.  Jilda Roche [place], DO 10/25/16 9:21 AM

## 2016-10-25 NOTE — Patient Instructions (Signed)

## 2016-11-03 ENCOUNTER — Other Ambulatory Visit: Payer: Self-pay | Admitting: Neurology

## 2016-11-16 ENCOUNTER — Other Ambulatory Visit: Payer: Self-pay | Admitting: Neurology

## 2016-11-17 ENCOUNTER — Other Ambulatory Visit: Payer: Self-pay | Admitting: Neurology

## 2016-11-17 ENCOUNTER — Other Ambulatory Visit: Payer: Self-pay

## 2016-11-17 MED ORDER — BUTALBITAL-ASA-CAFF-CODEINE 50-325-40-30 MG PO CAPS
1.0000 | ORAL_CAPSULE | Freq: Three times a day (TID) | ORAL | 0 refills | Status: DC | PRN
Start: 2016-11-17 — End: 2016-11-17

## 2016-11-17 MED ORDER — BUTALBITAL-ASA-CAFF-CODEINE 50-325-40-30 MG PO CAPS: 1.0000 | ORAL_CAPSULE | Freq: Three times a day (TID) | ORAL | 0 refills | Status: DC | PRN

## 2016-11-17 NOTE — Telephone Encounter (Signed)
Pt request Rx for Fiorinal [facility] on file.

## 2016-12-18 ENCOUNTER — Other Ambulatory Visit: Payer: Self-pay | Admitting: Neurology

## 2016-12-18 MED ORDER — BUTALBITAL-ASA-CAFF-CODEINE 50-325-40-30 MG PO CAPS: 1.0000 | ORAL_CAPSULE | Freq: Three times a day (TID) | ORAL | 0 refills | Status: DC | PRN

## 2017-01-10 ENCOUNTER — Encounter: Payer: Self-pay | Admitting: Neurology

## 2017-01-14 ENCOUNTER — Other Ambulatory Visit: Payer: Self-pay | Admitting: Neurology

## 2017-01-17 ENCOUNTER — Other Ambulatory Visit: Payer: Self-pay | Admitting: Neurology

## 2017-01-18 MED ORDER — BUTALBITAL-ASA-CAFF-CODEINE 50-325-40-30 MG PO CAPS: 1.0000 | ORAL_CAPSULE | Freq: Three times a day (TID) | ORAL | 0 refills | Status: DC | PRN

## 2017-01-18 MED ORDER — VENLAFAXINE HCL ER 150 MG PO CP24: 150.0000 mg | ORAL_CAPSULE | Freq: Every day | ORAL | 2 refills | Status: DC

## 2017-01-19 ENCOUNTER — Telehealth: Payer: Self-pay | Admitting: Neurology

## 2017-01-19 MED FILL — BUTALBITAL COMP-CODEINE #3: 50-325-40-3 | 14 days supply | Qty: 10 | Fill #0

## 2017-01-19 NOTE — Telephone Encounter (Signed)
Caller: [facility]  Urgent?   Reason for the call: Never received the fax for the prescription refill of Butalbital

## 2017-01-19 NOTE — Telephone Encounter (Signed)
Called Fiorinal to pharmacy. It did not e-scribe.

## 2017-01-23 ENCOUNTER — Encounter: Payer: Self-pay | Admitting: Neurology

## 2017-02-13 ENCOUNTER — Other Ambulatory Visit: Payer: Self-pay | Admitting: Neurology

## 2017-02-13 ENCOUNTER — Encounter: Payer: Self-pay | Admitting: Neurology

## 2017-02-13 MED ORDER — BUTALBITAL-ASA-CAFF-CODEINE 50-325-40-30 MG PO CAPS: 1.0000 | ORAL_CAPSULE | Freq: Three times a day (TID) | ORAL | 0 refills | Status: DC | PRN

## 2017-02-13 MED FILL — BUTALBITAL COMP-CODEINE #3: 50-325-40-3 | 4 days supply | Qty: 10 | Fill #0

## 2017-02-16 ENCOUNTER — Other Ambulatory Visit: Payer: Self-pay | Admitting: Neurology

## 2017-02-21 ENCOUNTER — Ambulatory Visit (INDEPENDENT_AMBULATORY_CARE_PROVIDER_SITE_OTHER): Payer: 59 | Admitting: Neurology

## 2017-02-21 ENCOUNTER — Encounter: Payer: Self-pay | Admitting: Neurology

## 2017-02-21 VITALS — BP 120/84 | HR 98 | Ht 69.0 in | Wt 197.4 lb

## 2017-02-21 DIAGNOSIS — F329 Major depressive disorder, single episode, unspecified: Secondary | ICD-10-CM | POA: Diagnosis not present

## 2017-02-21 DIAGNOSIS — G43009 Migraine without aura, not intractable, without status migrainosus: Secondary | ICD-10-CM

## 2017-02-21 DIAGNOSIS — G4484 Primary exertional headache: Secondary | ICD-10-CM

## 2017-02-21 DIAGNOSIS — R4589 Other symptoms and signs involving emotional state: Secondary | ICD-10-CM

## 2017-02-21 NOTE — Progress Notes (Addendum)
NEUROLOGY FOLLOW UP OFFICE NOTE  Bradley Richards [id number]  HISTORY OF PRESENT ILLNESS: Bradley Richards is a 51 year old male who follows up for migraine and primary exertional headache.   UPDATE: MRA of head from 01/23/17 was negative.  Migraines remain stable. Intensity:  7/10 Duration:  2 hours  Frequency:  3 to 4 days a month For abortive management, takes Zomig 5mg  NS, Goodys or Fiorinal (last resort.  He takes it about once or twice a week), which are effective. He still has mild daily headaches, 2-3/10, lasting 5 hours.  He takes Goodys daily.  Indomethacin hasn't been helpful for exertional headache.   Current NSAIDS:  no Current analgesics:  Fiorinal with codeine (last resort), Excedrin Migraine, Goodys Current triptans:  Zomig 5mg  NS Current anti-emetic:  promethazine 12.5mg  Current muscle relaxants:  no Current anti-anxiolytic:  no Current sleep aide:  no Current Antihypertensive medications:  no Current Antidepressant medications:  venlafaxine XR 150mg , amitriptyline 25mg  (2 times a month to help sleep) Current Anticonvulsant medications:  no Current Vitamins/Herbal/Supplements:  no Current Antihistamines/Decongestants:  Flonase Other therapy:  no   About 4 or 5 days a month, he will suddenly feel depressed.  He is not suicidal or otherwise a risk to himself or others.  He simply feels depressed and doesn't know why.  He has been on venlafaxine for 3 years.   Caffeine:  tea Alcohol:  no Smoker:  no Diet:  hydrates Exercise:  Walks.  Plays sports with his children Depression/stress:   stable Sleep hygiene:  varies Family history of headache:  No.   HISTORY: In early-mid 2016, he began having mild to moderate bifrontal daily headache.  Then in December 2016, he had a new headache, described as severe 7/10 bifrontal pounding headache, associated with nausea, vomiting, dizziness, diaphoresis and phonophobia.  More recently, there is accompanying blurred vision.  It  would last all day.  They steadily became more frequent and intense, occurring next in March, then June, July, August and then 3 weeks later.  Triggers include hunger, lack of sleep and exertion. Drinking lots of water helps.    He also reports headache on exertion, usually in the heat, such as playing with his children or during intercourse.   Past NSAIDS:  Ibuprofen, Toradol injection (in ED) Past analgesics:  Excedrin Migraine Past abortive triptans:  sumatriptan 50mg  (caused diarrhea) Past muscle relaxants:  no Past anti-emetic:  Phenergan injection (in ED) Past antihypertensive medications:  timolol Past antidepressant medications:  sertraline Past anticonvulsant medications:  topiramate 25mg  twice daily Past vitamins/Herbal/Supplements:  no   No prior history of headache  MRI of brain without contrast was performed on 10/09/16, which was negative.   PAST MEDICAL HISTORY: Past Medical History:  Diagnosis Date  . Depression    no counseling  . Head ache     MEDICATIONS: Current Outpatient Prescriptions on File Prior to Visit  Medication Sig Dispense Refill  . amitriptyline (ELAVIL) 25 MG tablet Take 1 tablet (25 mg total) by mouth at bedtime. 30 tablet 3  . Aspirin-Acetaminophen-Caffeine (EXCEDRIN MIGRAINE PO) Take by mouth as needed.     . butalbital-aspirin-caffeine-codeine (FIORINAL WITH CODEINE) 50-325-40-30 MG capsule Take 1 capsule by mouth every 8 (eight) hours as needed for migraine. 10 capsule 0  . fluticasone (FLONASE) 50 MCG/ACT nasal spray Place 2 sprays into the nose daily as needed for rhinitis. 16 g 3  . ibuprofen (ADVIL,MOTRIN) 200 MG tablet Take 200 mg by mouth every 6 (six) hours  as needed for pain.    . mometasone (ELOCON) 0.1 % lotion APP EXT TO THE SCALP  AS NEEDED  5  . promethazine (PHENERGAN) 12.5 MG tablet Take 1 tablet (12.5 mg total) by mouth every 6 (six) hours as needed for nausea or vomiting. 30 tablet 0  . venlafaxine XR (EFFEXOR-XR) 150 MG 24 hr  capsule Take 1 capsule (150 mg total) by mouth daily with breakfast. 30 capsule 2  . venlafaxine XR (EFFEXOR-XR) 150 MG 24 hr capsule TAKE 1 CAPSULE(150 MG) BY MOUTH DAILY WITH BREAKFAST 30 capsule 5   No current facility-administered medications on file prior to visit.     ALLERGIES: No Known Allergies  FAMILY HISTORY: Family History  Problem Relation Age of Onset  . Stroke Mother 8670  . Hypothyroidism Mother   . Heart disease Father        stent & bypass  . Alcohol abuse Unknown        maternal & paternal grandfather, & sister  . Prostate cancer Paternal Uncle   . Multiple sclerosis Brother   . Hypertension Neg Hx   . Diabetes Neg Hx   . Colon cancer Neg Hx     SOCIAL HISTORY: Social History   Social History  . Marital status: Married    Spouse name: N/A  . Number of children: N/A  . Years of education: N/A   Occupational History  . Not on file.   Social History Main Topics  . Smoking status: Never Smoker  . Smokeless tobacco: Never Used  . Alcohol use No  . Drug use: No  . Sexual activity: Not on file   Other Topics Concern  . Not on file   Social History Narrative  . No narrative on file    REVIEW OF SYSTEMS: Constitutional: No fevers, chills, or sweats, no generalized fatigue, change in appetite Eyes: No visual changes, double vision, eye pain Ear, nose and throat: No hearing loss, ear pain, nasal congestion, sore throat Cardiovascular: No chest pain, palpitations Respiratory:  No shortness of breath at rest or with exertion, wheezes GastrointestinaI: No nausea, vomiting, diarrhea, abdominal pain, fecal incontinence Genitourinary:  No dysuria, urinary retention or frequency Musculoskeletal:  No neck pain, back pain Integumentary: No rash, pruritus, skin lesions Neurological: as above Psychiatric: No depression, insomnia, anxiety Endocrine: No palpitations, fatigue, diaphoresis, mood swings, change in appetite, change in weight, increased  thirst Hematologic/Lymphatic:  No purpura, petechiae. Allergic/Immunologic: no itchy/runny eyes, nasal congestion, recent allergic reactions, rashes  PHYSICAL EXAM: Vitals:   02/21/17 1136  BP: 120/84  Pulse: 98   General: No acute distress.  Patient appears well-groomed.  normal body habitus. Head:  Normocephalic/atraumatic Eyes:  Fundi examined but not visualized Neck: supple, no paraspinal tenderness, full range of motion Heart:  Regular rate and rhythm Lungs:  Clear to auscultation bilaterally Back: No paraspinal tenderness Neurological Exam: alert and oriented to person, place, and time. Attention span and concentration intact, recent and remote memory intact, fund of knowledge intact.  Speech fluent and not dysarthric, language intact.  CN II-XII intact. Bulk and tone normal, muscle strength 5/5 throughout.  Sensation to light touch, temperature and vibration intact.  Deep tendon reflexes 2+ throughout, toes downgoing.  Finger to nose and heel to shin testing intact.  Gait normal  IMPRESSION: Migraine without aura, stable Chronic daily headache, likely medication-overuse Primary exertional headache Occasional depressed mood (ADDENDUM)  PLAN: 1.  Continue venlafaxine XR 150mg  daily 2.  Use Zomig NS earliest onset of migraine,  may repeat spray once after 2 hours if needed.  Do not exceed 2 sprays in 24 hours.  Limit Fiorinal to last resort (no more than 2 days per week). 3.  Stop Goodys.  You may stop cold [place] but you can wean off by one day every week. 4.  Stop indomethacin 5.  Regarding periods of depression, I suggested increasing venlafaxine to 225mg  daily.  He would like to hold off for now.  He would like to know why it happens, so I asked him to consider speaking with a therapist. 6.  Follow up in 6 months.  Shon Millet, DO  CC: Sharlene Dory, DO

## 2017-02-21 NOTE — Patient Instructions (Signed)
1.  Continue venlafaxine XR 150mg  daily 2.  Use Zomig NS earliest onset of migraine, may repeat spray once after 2 hours if needed.  Do not exceed 2 sprays in 24 hours.  Limit Fiorinal to last resort (no more than 2 days per week). 3.  Stop Goodys.  You may stop cold [place] but you can wean off by one day every week. 4.  Stop indomethacin 5.  Follow up in 6 months.

## 2017-03-18 ENCOUNTER — Telehealth: Payer: Self-pay | Admitting: Neurology

## 2017-03-19 ENCOUNTER — Other Ambulatory Visit: Payer: Self-pay | Admitting: *Deleted

## 2017-03-19 MED ORDER — BUTALBITAL-ASA-CAFF-CODEINE 50-325-40-30 MG PO CAPS: 1.0000 | ORAL_CAPSULE | Freq: Three times a day (TID) | ORAL | 0 refills | Status: DC | PRN

## 2017-03-27 ENCOUNTER — Other Ambulatory Visit: Payer: Self-pay | Admitting: *Deleted

## 2017-03-27 NOTE — Telephone Encounter (Signed)
Patient is on vacation and did not get to get his Butalbital with codeine from the drug store before they left so he would like to [facility] at [phone]

## 2017-03-27 NOTE — Telephone Encounter (Signed)
Rx sent in for #10 with no refills.

## 2017-03-27 NOTE — Telephone Encounter (Signed)
I called pharmacy and patient did not pick up Rx.  Ok [facility]?

## 2017-03-27 NOTE — Telephone Encounter (Signed)
Okay 

## 2017-04-20 ENCOUNTER — Encounter: Payer: Self-pay | Admitting: Neurology

## 2017-04-22 ENCOUNTER — Other Ambulatory Visit: Payer: Self-pay | Admitting: Neurology

## 2017-04-23 ENCOUNTER — Telehealth: Payer: Self-pay

## 2017-04-23 ENCOUNTER — Encounter: Payer: Self-pay | Admitting: Neurology

## 2017-04-23 DIAGNOSIS — G43709 Chronic migraine without aura, not intractable, without status migrainosus: Secondary | ICD-10-CM

## 2017-04-23 NOTE — Telephone Encounter (Signed)
Called Pt, advsd him dizziness could be from several different reasons and will need to contact PCP. Offered to make earlier appt after seeing PCP if needed. Pt verbalized understanding

## 2017-04-23 NOTE — Telephone Encounter (Signed)
Pls have him contact his PCP first, dizziness can be caused by a lot of different things, if blood pressure, allergies, ear infection have all been ruled out, then schedule earlier appt with Dr. Everlena Cooper. Thanks

## 2017-04-23 NOTE — Telephone Encounter (Signed)
Should I make an appt sooner than his Dec appt or suggest he contact PCP?

## 2017-06-08 ENCOUNTER — Other Ambulatory Visit: Payer: Self-pay | Admitting: Neurology

## 2017-06-08 MED FILL — BUTALBITAL COMP-CODEINE #3: 50-325-40-3 | 3 days supply | Qty: 10 | Fill #0

## 2017-06-11 MED ORDER — BUTALBITAL-ASA-CAFF-CODEINE 50-325-40-30 MG PO CAPS
1.0000 | ORAL_CAPSULE | Freq: Three times a day (TID) | ORAL | 0 refills | Status: DC | PRN
Start: 2017-06-11 — End: 2017-07-13

## 2017-07-13 ENCOUNTER — Other Ambulatory Visit: Payer: Self-pay | Admitting: Neurology

## 2017-07-13 MED ORDER — BUTALBITAL-ASA-CAFF-CODEINE 50-325-40-30 MG PO CAPS: 1.0000 | ORAL_CAPSULE | Freq: Three times a day (TID) | ORAL | 0 refills | Status: DC | PRN

## 2017-07-13 NOTE — Telephone Encounter (Signed)
Called in refill to [facility], spoke with Rosanne AshingJim. Called Pt. LM on cell VM

## 2017-08-13 ENCOUNTER — Other Ambulatory Visit: Payer: Self-pay | Admitting: Neurology

## 2017-08-14 ENCOUNTER — Other Ambulatory Visit: Payer: Self-pay | Admitting: Neurology

## 2017-08-15 MED ORDER — BUTALBITAL-ASA-CAFF-CODEINE 50-325-40-30 MG PO CAPS: 1.0000 | ORAL_CAPSULE | Freq: Three times a day (TID) | ORAL | 0 refills | Status: DC | PRN

## 2017-08-16 ENCOUNTER — Encounter: Payer: Self-pay | Admitting: Neurology

## 2017-08-16 NOTE — Telephone Encounter (Signed)
Spoke with Pt, advsd him we are unable to call controlled substances across state lines. Suggested he go to an Urgent Care if he needs to while in [place]. Pt staes he doesn't think he will need to, was just wanting to be prepared.

## 2017-08-23 ENCOUNTER — Ambulatory Visit: Payer: 59 | Admitting: Neurology

## 2017-09-13 ENCOUNTER — Other Ambulatory Visit: Payer: Self-pay | Admitting: Neurology

## 2017-09-14 ENCOUNTER — Encounter: Payer: Self-pay | Admitting: Neurology

## 2017-10-18 ENCOUNTER — Encounter: Payer: Self-pay | Admitting: Neurology

## 2017-10-18 ENCOUNTER — Other Ambulatory Visit: Payer: Self-pay | Admitting: Neurology

## 2017-10-18 MED ORDER — BUTALBITAL-ASA-CAFF-CODEINE 50-325-40-30 MG PO CAPS: 1.0000 | ORAL_CAPSULE | Freq: Three times a day (TID) | ORAL | 0 refills | Status: DC | PRN

## 2017-10-30 ENCOUNTER — Ambulatory Visit: Admit: 2017-10-30 | Discharge: 2017-10-30 | Payer: PRIVATE HEALTH INSURANCE | Attending: Family | Primary: Family

## 2017-10-30 DIAGNOSIS — Z0000 Encounter for general adult medical examination without abnormal findings: Secondary | ICD-10-CM

## 2017-10-30 LAB — AMB POC URINALYSIS DIP STICK MANUAL W/ MICRO
Bilirubin (UA POC): NEGATIVE
Blood (UA POC): NEGATIVE
Casts: 0 [HPF] (ref 0–1)
Crystals (UA POC): NEGATIVE
Epithelial Cells: 0 [HPF] (ref 0–?)
Glucose (UA POC): NEGATIVE
Ketones (UA POC): NEGATIVE
Leukocyte esterase (UA POC): NEGATIVE
Nitrites (UA POC): NEGATIVE
Other:: 0
Protein (UA POC): NEGATIVE
RBC: 0 [HPF] (ref 0–3)
Specific gravity (UA POC): 1.015 (ref 1.003–1.035)
Urobilinogen (UA POC): 0.2 (ref 0.2–1)
pH (UA POC): 7.5 (ref 4.6–8.0)

## 2017-10-30 MED ORDER — ALPRAZOLAM 0.25 MG TAB
0.25 mg | ORAL_TABLET | Freq: Three times a day (TID) | ORAL | 1 refills | Status: DC | PRN
Start: 2017-10-30 — End: 2017-11-27

## 2017-10-30 NOTE — Progress Notes (Signed)
Patient notified.

## 2017-10-30 NOTE — Patient Instructions (Addendum)
Panic Attacks: Care Instructions  Your Care Instructions    During a panic attack, you may have a feeling of intense fear or terror, trouble breathing, chest pain or tightness, heartbeat changes, dizziness, sweating, and shaking. A panic attack starts suddenly and usually lasts from 5 to 20 minutes but may last even longer. You have the most anxiety about 10 minutes after the attack starts. An attack can begin with a stressful event, or it can happen without a cause.  Although panic attacks can cause scary symptoms, you can learn to manage them with self-care, counseling, and medicine.  Follow-up care is a key part of your treatment and safety. Be sure to make and go to all appointments, and call your doctor if you are having problems. It's also a good idea to know your test results and keep a list of the medicines you take.  How can you care for yourself at home?  ?? Take your medicine exactly as directed. Call your doctor if you think you are having a problem with your medicine.  ?? Go to your counseling sessions and follow-up appointments.  ?? Recognize and accept your anxiety. Then, when you are in a situation that makes you anxious, say to yourself, "This is not an emergency. I feel uncomfortable, but I am not in danger. I can keep going even if I feel anxious."  ?? Be kind to your body:  ? Relieve tension with exercise or a massage.  ? Get enough rest.  ? Avoid alcohol, caffeine, nicotine, and illegal drugs. They can increase your anxiety level, cause sleep problems, or trigger a panic attack.  ? Learn and do relaxation techniques. See below for more about these techniques.  ?? Engage your mind. Get out and do something you enjoy. Go to a funny movie, or take a walk or hike. Plan your day. Having too much or too little to do can make you anxious.  ?? Keep a record of your symptoms. Discuss your fears with a good friend or family member, or join a support group for people with similar problems.  Talking to others sometimes relieves stress.  ?? Get involved in social groups, or volunteer to help others. Being alone sometimes makes things seem worse than they are.  ?? Get at least 30 minutes of exercise on most days of the week to relieve stress. Walking is a good choice. You also may want to do other activities, such as running, swimming, cycling, or playing tennis or team sports.  Relaxation techniques  Do relaxation exercises for 10 to 20 minutes a day. You can play soothing, relaxing music while you do them, if you wish.  ?? Tell others in your house that you are going to do your relaxation exercises. Ask them not to disturb you.  ?? Find a comfortable place, away from all distractions and noise.  ?? Lie down on your back, or sit with your back straight.  ?? Focus on your breathing. Make it slow and steady.  ?? Breathe in through your nose. Breathe out through either your nose or mouth.  ?? Breathe deeply, filling up the area between your navel and your rib cage. Breathe so that your belly goes up and down.  ?? Do not hold your breath.  ?? Breathe like this for 5 to 10 minutes. Notice the feeling of calmness throughout your whole body.  As you continue to breathe slowly and deeply, relax by doing the following for another 5 to 10 minutes:  ??   Tighten and relax each muscle group in your body. You can begin at your toes and work your way up to your head.  ?? Imagine your muscle groups relaxing and becoming heavy.  ?? Empty your mind of all thoughts.  ?? Let yourself relax more and more deeply.  ?? Become aware of the state of calmness that surrounds you.  ?? When your relaxation time is over, you can bring yourself back to alertness by moving your fingers and toes and then your hands and feet and then stretching and moving your entire body. Sometimes people fall asleep during relaxation, but they usually wake up shortly afterward.  ?? Always give yourself time to return to full alertness before you drive a  car or do anything that might cause an accident if you are not fully alert. Never play a relaxation tape while driving a car.  When should you call for help?  Call 911 anytime you think you may need emergency care. For example, call if:  ?? ?? You feel you cannot stop from hurting yourself or someone else.   ??Watch closely for changes in your health, and be sure to contact your doctor if:  ?? ?? Your panic attacks get worse.   ?? ?? You have new or different anxiety.   ?? ?? You are not getting better as expected.   Where can you learn more?  Go to http://www.healthwise.net/[facility]Help[employer]ctions.  Enter H601 in the search box to learn more about "Panic Attacks: Care Instructions."  Current as of: May 15, 2017  Content Version: 11.9  ?? 2006-2018 [employer]. Care instructions adapted under license by [employer] (which disclaims liability or warranty for this information). If you have questions about a medical condition or this instruction, always ask your healthcare professional. Healthwise, Incorporated disclaims any warranty or liability for your use of this information.

## 2017-10-30 NOTE — Progress Notes (Signed)
Chief Complaint   Patient presents with   ??? Complete Physical   ??? Panic Attack     started 2 months ago       Benjamin [name] is a 52 y.o. male who is here for a CPE.  In general patient does follow a good diet and good exercise plan.  Patient does not drink ETOH and does not smoke.  See ROS below    Thinks he is having panic attacks.  Has started having episodes of sweats, chest tightness, and SOB for the past 2 months.  States occurs almost daily and lasts a couple of hours.  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  States getting up and walking around helps.  He has never taken any anti-anxiety medications in the past.  Is not currently in any counseling/therapy.        Past Medical History:   Diagnosis Date   ??? Depression 2013    mild   ??? Migraines 2013       Current Outpatient Medications   Medication Sig Dispense Refill   ??? codeine-butalbital-aspirin-caffeine (FIORINAL-CODEINE #3) 30-50-325-40 mg per capsule Take 1 Cap by mouth every eight (8) hours as needed for Pain. Max Daily Amount: 3 Caps. 60 Cap 0       History reviewed. No pertinent surgical history.    Social History     Socioeconomic History   ??? Marital status: MARRIED     Spouse name: Not on file   ??? Number of children: Not on file   ??? Years of education: Not on file   ??? Highest education level: Not on file   Social Needs   ??? Financial resource strain: Not on file   ??? Food insecurity - worry: Not on file   ??? Food insecurity - inability: Not on file   ??? Transportation needs - medical: Not on file   ??? Transportation needs - non-medical: Not on file   Occupational History   ??? Not on file   Tobacco Use   ??? Smoking status: Never Smoker   ??? Smokeless tobacco: Never Used   Substance and Sexual Activity   ??? Alcohol use: No   ??? Drug use: No   ??? Sexual activity: Yes     Partners: Female     Birth control/protection: Surgical   Other Topics Concern   ??? Not on file   Social History Narrative    ??? Not on file       Family History   Problem Relation Age of Onset   ??? Stroke Mother         mild   ??? Thyroid Disease Mother         hypothyroidism   ??? Heart Failure Mother    ??? Heart Disease Father    ??? Heart Attack Father    ??? Other Maternal Grandmother         angina   ??? No Known Problems Maternal Grandfather    ??? Cancer Paternal Grandmother    ??? Cancer Paternal Grandfather    ??? Other Sister 64        Staph infection/ Guillian Barre   ??? Other Brother         quadraplegic due to MVA   ??? MS Brother    ??? No Known Problems Son    ??? No Known Problems Son    ??? No Known  Problems Son    ??? No Known Problems Daughter        No Known Allergies        Review of Systems - General ROS: negative for - chills, fatigue, fever, malaise, night sweats, weight gain or weight loss  Ophthalmic ROS: negative for - decreased vision, eye pain or loss of vision  ENT ROS: negative for - epistaxis, headaches, hearing change, nasal congestion, sinus pain, sore throat or tinnitus  Allergy and Immunology ROS: negative for - hives, itchy/watery eyes, nasal congestion or seasonal allergies  Hematological and Lymphatic ROS: negative for - bleeding problems, bruising, night sweats, swollen lymph nodes or weight loss  Endocrine ROS: negative for -  mood swings, polydipsia/polyuria or unexpected weight changes  Respiratory ROS: negative for - cough, orthopnea, shortness of breath, tachypnea or wheezing  Cardiovascular ROS: negative for - chest pain, dyspnea on exertion, edema, irregular heartbeat, loss of consciousness, orthopnea, palpitations, paroxysmal nocturnal dyspnea, rapid heart rate or shortness of breath  Gastrointestinal ROS: negative for - abdominal pain, appetite loss, blood in stools, change in bowel habits, change in stools, constipation, diarrhea or heartburn  Genito-Urinary ROS: negative for - urinary hesitancy, urinary frequency, weak urinary stream, testicle lumps or pain, penile discharge   Musculoskeletal ROS: negative for - gait disturbance, joint pain, joint stiffness, joint swelling or muscular weakness  Neurological ROS: negative for - gait disturbance, headaches, impaired coordination/balance, memory loss, numbness/tingling, seizures, tremors or weakness  Dermatological ROS: negative for - dry skin, eczema, hair changes, mole changes, pruritus or rash          Vitals:    10/30/17 0919   BP: (!) 119/111   Pulse: 66   Temp: 97.8 ??F (36.6 ??C)   SpO2: 99%   Weight: 196 lb (88.9 kg)   Height: 5' 9" (1.753 m)   recheck 110/80      Physical Examination:   General appearance - alert, well appearing, and in no distress, oriented to person, place, and time, weight above goal, in no respiratory distress  Mental status - alert, oriented to person, place, and time, normal mood, behavior, speech, dress, motor activity, and thought processes  Eyes - pupils equal and reactive, extraocular eye movements intact, sclera anicteric  Ears - bilateral TM's and external ear canals normal  Nose - normal and patent, no erythema, discharge or polyps  Mouth - mucous membranes moist, pharynx normal without lesions, tonsils normal, dental hygiene good and tongue normal  Neck - supple, no significant adenopathy, carotids upstroke normal bilaterally, no bruits  Thyroid exam: thyroid is normal in size without nodules or tenderness  Lymphatics - no palpable lymphadenopathy, no hepatosplenomegaly  Chest - clear to auscultation, no wheezes, rales or rhonchi, symmetric air entry  Heart - normal rate, regular rhythm, normal S1, S2, no murmurs, rubs, clicks or gallops  Abdomen - soft, nontender, nondistended, no masses or organomegaly  GU - Not examined, Prostate exam deferred.  Back exam - full range of motion, no tenderness, palpable spasm or pain on motion  Neurological - alert, oriented, normal speech, no focal findings or movement disorder noted  Musculoskeletal - no joint tenderness, deformity or swelling   Extremities - peripheral pulses normal, no pedal edema, no clubbing or cyanosis  Skin - normal coloration and turgor, no rashes, no suspicious skin lesions noted      Assessment/plan    Diagnoses and all orders for this visit:    1. Routine medical exam  Patient appears to be  in good health.  Encouraged or reinforced healthy diet/lifestyle.  Discussed and encouraged appropriate screenings and vaccines and administered if patient allowed.  Will review screening labs with patient as they return. Due to his age and general risk profile we did not elect to send hemoccult care kit with patient  -     CBC WITH AUTOMATED DIFF  -     METABOLIC PANEL, COMPREHENSIVE  -     LIPID PANEL  -     TSH 3RD GENERATION  -     AMB POC URINALYSIS DIP STICK MANUAL W/ MICRO  -     COLLECTION VENOUS BLOOD,VENIPUNCTURE    2. Panic attack as reaction to stress  Situation requires use of benzodiazepine.  Reviewed potential complications and side effects that included but was not limited to: Sedation, confusion, slowed reaction time, slurred speech and slowed mentation and reaction.  Additionally, was informed that medication can be addictive and therefore must be used only when appropriate.  I personally reviewed the [facility] prescription database and find no evidence of improprieties in regards to patient's use and prescription history.   -     ALPRAZolam (XANAX) 0.25 mg tablet; Take 1 Tab by mouth three (3) times daily as needed for Anxiety. Max Daily Amount: 0.75 mg.    3. Major depressive disorder with single episode, in full remission ([place])        Carmela Hurt, NP

## 2017-10-30 NOTE — Progress Notes (Signed)
Glucose, liver and kidney tests are good.  Lipids are ok.  Improving diet and exercise will help. Thyroid test is good.

## 2017-10-31 LAB — CBC WITH AUTOMATED DIFF
ABS. BASOPHILS: 0 10*3/uL (ref 0.0–0.2)
ABS. EOSINOPHILS: 0.1 10*3/uL (ref 0.0–0.4)
ABS. IMM. GRANS.: 0 10*3/uL (ref 0.0–0.1)
ABS. MONOCYTES: 0.7 10*3/uL (ref 0.1–0.9)
ABS. NEUTROPHILS: 3.3 10*3/uL (ref 1.4–7.0)
Abs Lymphocytes: 1.3 10*3/uL (ref 0.7–3.1)
BASOPHILS: 1 %
EOSINOPHILS: 2 %
HCT: 42.7 % (ref 37.5–51.0)
HGB: 14.8 g/dL (ref 13.0–17.7)
IMMATURE GRANULOCYTES: 0 %
Lymphocytes: 24 %
MCH: 31.2 pg (ref 26.6–33.0)
MCHC: 34.7 g/dL (ref 31.5–35.7)
MCV: 90 fL (ref 79–97)
MONOCYTES: 12 %
NEUTROPHILS: 61 %
PLATELET: 202 10*3/uL (ref 150–379)
RBC: 4.75 x10E6/uL (ref 4.14–5.80)
RDW: 13.4 % (ref 12.3–15.4)
WBC: 5.3 10*3/uL (ref 3.4–10.8)

## 2017-10-31 LAB — METABOLIC PANEL, COMPREHENSIVE
A-G Ratio: 2 (ref 1.2–2.2)
ALT (SGPT): 21 IU/L (ref 0–44)
AST (SGOT): 17 IU/L (ref 0–40)
Albumin: 4.4 g/dL (ref 3.5–5.5)
Alk. phosphatase: 69 IU/L (ref 39–117)
BUN/Creatinine ratio: 10 (ref 9–20)
BUN: 11 mg/dL (ref 6–24)
Bilirubin, total: 0.4 mg/dL (ref 0.0–1.2)
CO2: 24 mmol/L (ref 20–29)
Calcium: 9.4 mg/dL (ref 8.7–10.2)
Chloride: 101 mmol/L (ref 96–106)
Creatinine: 1.13 mg/dL (ref 0.76–1.27)
GFR est AA: 88 mL/min/{1.73_m2} (ref >59)
GFR est non-AA: 76 mL/min/{1.73_m2} (ref >59)
GLOBULIN, TOTAL: 2.2 g/dL (ref 1.5–4.5)
Glucose: 97 mg/dL (ref 65–99)
Potassium: 4.4 mmol/L (ref 3.5–5.2)
Protein, total: 6.6 g/dL (ref 6.0–8.5)
Sodium: 141 mmol/L (ref 134–144)

## 2017-10-31 LAB — TSH 3RD GENERATION: TSH: 2.68 u[IU]/mL (ref 0.450–4.500)

## 2017-10-31 LAB — LIPID PANEL
Cholesterol, total: 183 mg/dL (ref 100–199)
HDL Cholesterol: 40 mg/dL (ref >39)
LDL, calculated: 114 mg/dL — ABNORMAL HIGH (ref 0–99)
Triglyceride: 146 mg/dL (ref 0–149)
VLDL, calculated: 29 mg/dL (ref 5–40)

## 2017-11-19 ENCOUNTER — Other Ambulatory Visit: Payer: Self-pay | Admitting: Neurology

## 2017-11-19 ENCOUNTER — Telehealth: Payer: Self-pay | Admitting: Neurology

## 2017-11-19 MED ORDER — BUTALBITAL-ASA-CAFF-CODEINE 50-325-40-30 MG PO CAPS: 1.0000 | ORAL_CAPSULE | Freq: Three times a day (TID) | ORAL | 0 refills | Status: DC | PRN

## 2017-11-19 NOTE — Telephone Encounter (Signed)
*  STAT* If patient is at the pharmacy, call can be transferred to refill team.  1.     Which medications need to be refilled? (please list name of each medication and dose if know) Fiorinal   2.   Which pharmacy/location (including street and city if local pharmacy) is medication to be sent to? [facility]  3.     Do they need a 30 or 90 day supply? 10 Tablets  [facility] called and wanted to verify a fax for this prescription CB# [phone]

## 2017-11-19 NOTE — Telephone Encounter (Signed)
Last OV 02/21/2017 Last refilled 10/18/17 #10 no refills Next OV 12/06/2017 Please advise

## 2017-11-20 NOTE — Telephone Encounter (Signed)
Spoke with Pharmacist to verify medication sent from [facility].

## 2017-11-27 ENCOUNTER — Ambulatory Visit: Admit: 2017-11-27 | Discharge: 2017-11-27 | Payer: PRIVATE HEALTH INSURANCE | Attending: Family | Primary: Family

## 2017-11-27 ENCOUNTER — Encounter: Attending: Family | Primary: Family

## 2017-11-27 DIAGNOSIS — F419 Anxiety disorder, unspecified: Secondary | ICD-10-CM

## 2017-11-27 MED ORDER — ALPRAZOLAM 0.5 MG TAB
0.5 mg | ORAL_TABLET | Freq: Two times a day (BID) | ORAL | 0 refills | Status: DC | PRN
Start: 2017-11-27 — End: 2018-06-04

## 2017-11-27 NOTE — Progress Notes (Signed)
Chief Complaint   Patient presents with   ??? Follow-up     FOLLOW UP ON ANXIETY        Benjamin [name] is a 52 y.o. male        HPI  Here to follow up on his anxiety. Came in for his wva about 1 month ago. Had told Shanda Bumps mitchell about panic attacks.  States that was given xanax to use as needed.  States that has been under lots of stress. 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   States that taking the 0.25mg  xanax pretty regularly.  States that will wake up at 3am.  States that doesn't notice a notable difference, but makes it easier to go back to bed.       Past Medical History:   Diagnosis Date   ??? Depression 2013    mild   ??? Migraines 2013       History reviewed. No pertinent surgical history.      Social History     Socioeconomic History   ??? Marital status: MARRIED     Spouse name: Not on file   ??? Number of children: Not on file   ??? Years of education: Not on file   ??? Highest education level: Not on file   Occupational History   ??? Not on file   Social Needs   ??? Financial resource strain: Not on file   ??? Food insecurity:     Worry: Not on file     Inability: Not on file   ??? Transportation needs:     Medical: Not on file     Non-medical: Not on file   Tobacco Use   ??? Smoking status: Never Smoker   ??? Smokeless tobacco: Never Used   Substance and Sexual Activity   ??? Alcohol use: No   ??? Drug use: No   ??? Sexual activity: Yes     Partners: Female     Birth control/protection: Surgical   Lifestyle   ??? Physical activity:     Days per week: Not on file     Minutes per session: Not on file   ??? Stress: Not on file   Relationships   ??? Social connections:     Talks on phone: Not on file     Gets together: Not on file     Attends religious service: Not on file     Active member of club or organization: Not on file     Attends meetings of clubs or organizations: Not on file     Relationship status: Not on file   ??? Intimate partner violence:     Fear of current or ex partner: Not on file      Emotionally abused: Not on file     Physically abused: Not on file     Forced sexual activity: Not on file   Other Topics Concern   ??? Not on file   Social History Narrative   ??? Not on file       Family History   Problem Relation Age of Onset   ??? Stroke Mother         mild   ??? Thyroid Disease Mother         hypothyroidism   ??? Heart Failure Mother    ??? Heart Disease Father    ??? Heart Attack Father    ??? Other Maternal Grandmother         angina   ???  No Known Problems Maternal Grandfather    ??? Cancer Paternal Grandmother    ??? Cancer Paternal Grandfather    ??? Other Sister [id number]        Staph infection/ Guillian Barre   ??? Other Brother         quadraplegic due to MVA   ??? MS Brother    ??? No Known Problems Son    ??? No Known Problems Son    ??? No Known Problems Son    ??? No Known Problems Daughter        Current Outpatient Medications   Medication Sig Dispense Refill   ??? ALPRAZolam (XANAX) 0.25 mg tablet Take 1 Tab by mouth three (3) times daily as needed for Anxiety. Max Daily Amount: 0.75 mg. 90 Tab 1   ??? codeine-butalbital-aspirin-caffeine (FIORINAL-CODEINE #3) 30-50-325-40 mg per capsule Take 1 Cap by mouth every eight (8) hours as needed for Pain. Max Daily Amount: 3 Caps. 60 Cap 0         Review of Systems - Psychological ROS: positive for - anxiety  Denies chest pain, denies nausea, denies headache.       Visit Vitals  BP 121/79   Pulse 70   Temp 98 ??F (36.7 ??C)   Resp 16   Ht 5\' 9"  (1.753 m)   Wt 203 lb 3.2 oz (92.2 kg)   SpO2 97%   BMI 30.01 kg/m??           Physical Examination: General appearance - alert, well appearing, and in no distress and oriented to person, place, and time  Mental status - alert, oriented to person, place, and time, normal mood, behavior, speech, dress, motor activity, and thought processes  Eyes - pupils equal and reactive, extraocular eye movements intact  Ears - bilateral TM's and external ear canals normal  Nose - normal and patent, no erythema, discharge or polyps   Mouth - mucous membranes moist, pharynx normal without lesions  Neck - supple, no significant adenopathy  Lymphatics - no palpable lymphadenopathy, no hepatosplenomegaly  Chest - clear to auscultation, no wheezes, rales or rhonchi, symmetric air entry  Heart - normal rate, regular rhythm, normal S1, S2, no murmurs, rubs, clicks or gallops  Abdomen - soft, nontender, nondistended, no masses or organomegaly  Neurological - alert, oriented, normal speech, no focal findings or movement disorder noted  Musculoskeletal - no joint tenderness, deformity or swelling  Skin - normal coloration and turgor, no rashes, no suspicious skin lesions noted        .  1. Anxiety  He has improvement. Continue the zoloft, I am going to up his xanax to 0.5mg  and have him use it bid as needed.  Told to only uses as needed, and will give 30 tablets to see how he is doing.

## 2017-12-06 ENCOUNTER — Encounter: Payer: Self-pay | Admitting: Neurology

## 2017-12-06 ENCOUNTER — Ambulatory Visit: Payer: 59 | Admitting: Neurology

## 2017-12-06 VITALS — BP 114/74 | HR 60 | Ht 69.0 in | Wt 204.2 lb

## 2017-12-06 DIAGNOSIS — G43709 Chronic migraine without aura, not intractable, without status migrainosus: Secondary | ICD-10-CM | POA: Diagnosis not present

## 2017-12-06 MED ORDER — GALCANEZUMAB-GNLM 120 MG/ML ~~LOC~~ SOAJ
240.0000 mg | Freq: Once | SUBCUTANEOUS | 0 refills | Status: AC
Start: 2017-12-06 — End: 2017-12-06

## 2017-12-06 NOTE — Patient Instructions (Signed)
Migraine Recommendations: 1.  We will try one of the new migraine preventatives, Emgality (monthly self-injection) 2.  Limit use of pain relievers to no more than 2 days out of the week.  These medications include acetaminophen, ibuprofen, triptans and narcotics.  This will help reduce risk of rebound headaches. 3.  Be aware of common food triggers such as processed sweets, processed foods with nitrites (such as deli meat, hot dogs, sausages), foods with MSG, alcohol (such as wine), chocolate, certain cheeses, certain fruits (dried fruits, bananas, some citrus fruit), vinegar, diet soda. 4.  Avoid caffeine 5.  Routine exercise 6.  Proper sleep hygiene 7.  Stay adequately hydrated with water 8.  Keep a headache diary. 9.  Maintain proper stress management. 10.  Do not skip meals. 11.  Consider supplements:  Magnesium citrate 400mg  to 600mg  daily, riboflavin 400mg , Coenzyme Q 10 100mg  three times daily 12.  Follow up in 4 months.   Recurrent Migraine Headache A migraine headache is very bad, throbbing pain that is usually on one side of your head. Recurrent migraines keep coming back (recurring). Talk with your doctor about what things may bring on (trigger) your migraine headaches. Follow these instructions at home: Medicines  Take over-the-counter and prescription medicines only as told by your doctor.  Do not drive or use heavy machinery while taking prescription pain medicine. Lifestyle  Do not use any products that contain nicotine or tobacco, such as cigarettes and e-cigarettes. If you need help quitting, ask your doctor.  Limit alcohol intake to no more than 1 drink a day for nonpregnant women and 2 drinks a day for men. One drink equals 12 oz of beer, 5 oz of wine, or 1 oz of hard liquor.  Get 7-9 hours of sleep each night.  Lessen any stress in your life. Ask your doctor about ways to lower your stress.  Stay at a healthy weight. Talk with your doctor if you need help losing  weight.  Get regular exercise. General instructions  Keep a journal to find out if certain things bring on migraine headaches. For example, write down: ? What you eat and drink. ? How much sleep you get. ? Any change to your diet or medicines.  Lie down in a dark, quiet room when you have a migraine.  Try placing a cool towel over your head when you have a migraine.  Keep lights dim if bright lights bother you or make your migraines worse.  Keep all follow-up visits as told by your doctor. This is important. Contact a doctor if:  Medicine does not help your migraines.  Your pain keeps coming back.  You have a fever.  You have weight loss without trying. Get help right away if:  Your migraine becomes really bad and medicine does not help.  You have a stiff neck.  You have trouble seeing.  Your muscles are weak or you lose control of your muscles.  You lose your balance or have trouble walking.  You feel like you will pass out (faint) or you pass out.  You have really bad symptoms that are different than your first symptoms.  You start having sudden, very bad headaches that last for one second or less, like a thunderclap. Summary  A migraine headache is very bad, throbbing pain that is usually on one side of your head.  Talk with your doctor about what things may bring on (trigger) your migraine headaches.  Take over-the-counter and prescription medicines only as told  by your doctor.  Lie down in a dark, quiet room when you have a migraine.  Keep a journal about what you eat and drink, how much sleep you get, and any changes to your medicines. This can help you find out if certain things make you have migraine headaches. This information is not intended to replace advice given to you by your health care provider. Make sure you discuss any questions you have with your health care provider. Document Released: 05/30/2008 Document Revised: 07/14/2016 Document  Reviewed: 07/14/2016 Elsevier Interactive Patient Education  2017 [employer].

## 2017-12-06 NOTE — Progress Notes (Signed)
NEUROLOGY FOLLOW UP OFFICE NOTE  Bradley Richards [id number]  HISTORY OF PRESENT ILLNESS: Bradley Richards is a 52 year old male who follows up for migraine and primary exertional headache.   UPDATE: He stopped the venlafaxine due to dizziness and is now back on sertraline  Migraines remain stable. Intensity:  7/10 Duration:  2 hours  Frequency:  3 to 4 days a month but has daily headaches. He takes some kind of pain reliever daily (Fiorinal, Excedrin, Goodys) Current NSAIDS:  no Current analgesics:  Fiorinal with codeine (last resort), Excedrin Migraine, Goodys Current triptans: no Current anti-emetic:  promethazine 12.5mg  Current muscle relaxants:  no Current anti-anxiolytic:  no Current sleep aide:  no Current Antihypertensive medications:  no Current Antidepressant medications:  Sertraline 50mg  Current Anticonvulsant medications:  no Current Vitamins/Herbal/Supplements:  no Current Antihistamines/Decongestants:  Flonase Other therapy:  no   Caffeine:  tea Alcohol:  no Smoker:  no Diet:  hydrates Exercise:  Walks.  Plays sports with his children Depression and anxiety:  No Sleep hygiene:  varies Family history of headache:  No.   HISTORY: In early-mid 2016, he began having mild to moderate bifrontal daily headache.  Then in December 2016, he had a new headache, described as severe 7/10 bifrontal pounding headache, associated with nausea, vomiting, dizziness, diaphoresis and phonophobia.  More recently, there is accompanying blurred vision.  It would last all day.  They steadily became more frequent and intense, occurring next in March, then June, July, August and then 3 weeks later.  Triggers include hunger, lack of sleep and exertion. Drinking lots of water helps.    He also reports headache on exertion, usually in the heat, such as playing with his children or during intercourse.   Past NSAIDS:  Indomethacin, ibuprofen, Toradol injection (in ED) Past analgesics:   BCs Past abortive triptans:  sumatriptan 50mg  (caused diarrhea), Zomig 5mg  NS (ineffective) Past muscle relaxants:  no Past anti-emetic:  Phenergan injection (in ED) Past antihypertensive medications:  timolol Past antidepressant medications:  Venlafaxine XR 150mg , amitriptyline Past anticonvulsant medications:  topiramate 25mg  twice daily Past vitamins/Herbal/Supplements:  no   No prior history of headache   MRI of brain without contrast was performed on 10/09/16, which was negative.  MRA of head from 01/23/17 was negative.  PAST MEDICAL HISTORY: Past Medical History:  Diagnosis Date  . Depression    no counseling  . Head ache     MEDICATIONS: Current Outpatient Medications on File Prior to Visit  Medication Sig Dispense Refill  . amitriptyline (ELAVIL) 25 MG tablet Take 1 tablet (25 mg total) by mouth at bedtime. 30 tablet 3  . Aspirin-Acetaminophen-Caffeine (EXCEDRIN MIGRAINE PO) Take by mouth as needed.     . butalbital-aspirin-caffeine-codeine (FIORINAL WITH CODEINE) 50-325-40-30 MG capsule Take 1 capsule by mouth every 8 (eight) hours as needed for migraine. 10 capsule 0  . fluticasone (FLONASE) 50 MCG/ACT nasal spray Place 2 sprays into the nose daily as needed for rhinitis. 16 g 3  . ibuprofen (ADVIL,MOTRIN) 200 MG tablet Take 200 mg by mouth every 6 (six) hours as needed for pain.    . mometasone (ELOCON) 0.1 % lotion APP EXT TO THE SCALP  AS NEEDED  5  . promethazine (PHENERGAN) 12.5 MG tablet Take 1 tablet (12.5 mg total) by mouth every 6 (six) hours as needed for nausea or vomiting. 30 tablet 0  . sertraline (ZOLOFT) 50 MG tablet TK 1 T PO QD  3   No current facility-administered  medications on file prior to visit.     ALLERGIES: No Known Allergies  FAMILY HISTORY: Family History  Problem Relation Age of Onset  . Stroke Mother [id number]  . Hypothyroidism Mother   . Heart disease Father        stent & bypass  . Alcohol abuse Unknown        maternal & paternal  grandfather, & sister  . Prostate cancer Paternal Uncle   . Multiple sclerosis Brother   . Hypertension Neg Hx   . Diabetes Neg Hx   . Colon cancer Neg Hx   .  SOCIAL HISTORY: Social History   Socioeconomic History  . Marital status: Married    Spouse name: Not on file  . Number of children: Not on file  . Years of education: Not on file  . Highest education level: Not on file  Occupational History  . Not on file  Social Needs  . Financial resource strain: Not on file  . Food insecurity:    Worry: Not on file    Inability: Not on file  . Transportation needs:    Medical: Not on file    Non-medical: Not on file  Tobacco Use  . Smoking status: Never Smoker  . Smokeless tobacco: Never Used  Substance and Sexual Activity  . Alcohol use: No  . Drug use: No  . Sexual activity: Not on file  Lifestyle  . Physical activity:    Days per week: Not on file    Minutes per session: Not on file  . Stress: Not on file  Relationships  . Social connections:    Talks on phone: Not on file    Gets together: Not on file    Attends religious service: Not on file    Active member of club or organization: Not on file    Attends meetings of clubs or organizations: Not on file    Relationship status: Not on file  . Intimate partner violence:    Fear of current or ex partner: Not on file    Emotionally abused: Not on file    Physically abused: Not on file    Forced sexual activity: Not on file  Other Topics Concern  . Not on file  Social History Narrative  . Not on file    REVIEW OF SYSTEMS: Constitutional: No fevers, chills, or sweats, no generalized fatigue, change in appetite Eyes: No visual changes, double vision, eye pain Ear, nose and throat: No hearing loss, ear pain, nasal congestion, sore throat Cardiovascular: No chest pain, palpitations Respiratory:  No shortness of breath at rest or with exertion, wheezes GastrointestinaI: No nausea, vomiting, diarrhea, abdominal  pain, fecal incontinence Genitourinary:  No dysuria, urinary retention or frequency Musculoskeletal:  No neck pain, back pain Integumentary: No rash, pruritus, skin lesions Neurological: as above Psychiatric: No depression, insomnia, anxiety Endocrine: No palpitations, fatigue, diaphoresis, mood swings, change in appetite, change in weight, increased thirst Hematologic/Lymphatic:  No purpura, petechiae. Allergic/Immunologic: no itchy/runny eyes, nasal congestion, recent allergic reactions, rashes  PHYSICAL EXAM: Vitals:   12/06/17 0742  BP: 114/74  Pulse: 60  SpO2: 98%   General: No acute distress.  Patient appears well-groomed.   Head:  Normocephalic/atraumatic Eyes:  Fundi examined but not visualized Neck: supple, no paraspinal tenderness, full range of motion Heart:  Regular rate and rhythm Lungs:  Clear to auscultation bilaterally Back: No paraspinal tenderness Neurological Exam: alert and oriented to person, place, and time. Attention span and concentration intact, recent  and remote memory intact, fund of knowledge intact.  Speech fluent and not dysarthric, language intact.  CN II-XII intact. Bulk and tone normal, muscle strength 5/5 throughout.  Sensation to light touch  intact.  Deep tendon reflexes 2+ throughout.  Finger to nose testing intact.  Gait normal  IMPRESSION: Chronic migraine without aura  PLAN: 1.  We will try an anti-CGRP, Emgality 2.  He needs to limit use of pain relievers to no more than 2 days out of week to prevent rebound headache.  Advised to limit Fiorinal to no more than 2 days out of week.  Eventually, I would like him off of Fiorinal completely. 3.  Headache diary 4.  Encouraged continued exercise, diet, water intake, reviewed sleep hygiene 5.  Follow up in 4 months.  Shon Millet, DO  CC: Dr. Carmelia Roller

## 2017-12-26 ENCOUNTER — Other Ambulatory Visit: Payer: Self-pay | Admitting: Neurology

## 2017-12-26 MED ORDER — BUTALBITAL-ASA-CAFF-CODEINE 50-325-40-30 MG PO CAPS: 1.0000 | ORAL_CAPSULE | Freq: Three times a day (TID) | ORAL | 0 refills | Status: DC | PRN

## 2017-12-26 NOTE — Telephone Encounter (Signed)
Called in Rx. Spoke with Bear StearnsCourtney.

## 2017-12-27 ENCOUNTER — Encounter: Payer: Self-pay | Admitting: Neurology

## 2018-01-16 ENCOUNTER — Encounter: Payer: Self-pay | Admitting: Neurology

## 2018-02-25 ENCOUNTER — Other Ambulatory Visit: Payer: Self-pay | Admitting: Neurology

## 2018-02-25 MED ORDER — BUTALBITAL-ASA-CAFF-CODEINE 50-325-40-30 MG PO CAPS: 1.0000 | ORAL_CAPSULE | Freq: Three times a day (TID) | ORAL | 0 refills | Status: DC | PRN

## 2018-04-01 ENCOUNTER — Other Ambulatory Visit: Payer: Self-pay | Admitting: Neurology

## 2018-04-01 NOTE — Telephone Encounter (Signed)
This is a Jaffe patient. 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 Everlena CooperJaffe if he is going to continue this.

## 2018-04-02 ENCOUNTER — Encounter: Payer: Self-pay | Admitting: Neurology

## 2018-04-03 ENCOUNTER — Telehealth: Payer: Self-pay

## 2018-04-03 MED ORDER — BUTALBITAL-ASA-CAFF-CODEINE 50-325-40-30 MG PO CAPS: 1.0000 | ORAL_CAPSULE | Freq: Three times a day (TID) | ORAL | 0 refills | Status: DC | PRN

## 2018-04-03 MED ORDER — SERTRALINE HCL 50 MG PO TABS: 50.0000 mg | ORAL_TABLET | Freq: Every day | ORAL | 0 refills | Status: DC

## 2018-04-03 NOTE — Telephone Encounter (Signed)
Called and spoke with Pt. He had actually responded via my chart. I had just missed his response. I advised I will call in Rx, Pt asked I also call in sertraline 50mg .  [place] [facility] [place]'s

## 2018-04-18 NOTE — Progress Notes (Deleted)
NEUROLOGY FOLLOW UP OFFICE NOTE  Bradley Richards [id number]  HISTORY OF PRESENT ILLNESS: Bradley Richards is a 52 year old male who follows up for migraine and primary exertional headache.    UPDATE: Last visit, he was started on Emgality. Intensity:  *** Duration:  *** Frequency:  *** Frequency of abortive medication:  *** Current NSAIDS:  no Current analgesics:  Excedrin Migraine, Goody, Fiorinal with Codeine (last resort) Current triptans:  no Current ergotamine:  no Current anti-emetic:  Promethazine 12.5mg  Current muscle relaxants:  no Current anti-anxiolytic:  no Current sleep aide:  no Current Antihypertensive medications:  no Current Antidepressant medications:  Sertraline 50mg  Current Anticonvulsant medications:  no Current anti-CGRP:  Emgality Current Vitamins/Herbal/Supplements:  no Current Antihistamines/Decongestants:  Flonase Other therapy:  no  Caffeine:  tea Alcohol:  no Smoker:  no Diet:  hydrates Exercise:  Walks.  Plays sports with his children. Depression:  no; Anxiety:  no Other pain:  no Sleep hygiene:  varies  HISTORY: In early-mid 2016, he began having mild to moderate bifrontal daily headache.  Then in December 2016, he had a new headache, described as severe 7/10 bifrontal pounding headache, associated with nausea, vomiting, dizziness, diaphoresis and phonophobia.  There is accompanying blurred vision.  It would last all day.  They steadily became more frequent and intense, occurring next in March, then June, July, August and then 3 weeks later.  Triggers include hunger, lack of sleep and exertion.  Drinking lots of water helps.  He also reports headache on exertion, usually in the heat, such as playing with his children or during intercourse.  Past NSAIDS:  Indomethacin, ibuprofen, Toradol injection (in ED) Past analgesics:  BCs Past abortive triptans:  sumatriptan 50mg  (caused diarrhea), Zomig 5mg  NS (ineffective) Past muscle relaxants:   no Past anti-emetic:  Phenergan injection (in ED) Past antihypertensive medications:  timolol Past antidepressant medications:  Venlafaxine XR 150mg , amitriptyline Past anticonvulsant medications:  topiramate 25mg  twice daily Past vitamins/Herbal/Supplements:  no  No prior history of headache  MRI of brain without contrast was performed on 10/09/16, which was negative.  MRA of head from 01/23/17 was negative.   PAST MEDICAL HISTORY: Past Medical History:  Diagnosis Date  . Depression    no counseling  . Head ache     MEDICATIONS: Current Outpatient Medications on File Prior to Visit  Medication Sig Dispense Refill  . Aspirin-Acetaminophen-Caffeine (EXCEDRIN MIGRAINE PO) Take by mouth as needed.     . butalbital-aspirin-caffeine-codeine (FIORINAL WITH CODEINE) 50-325-40-30 MG capsule Take 1 capsule by mouth every 8 (eight) hours as needed for migraine. 10 capsule 0  . fluticasone (FLONASE) 50 MCG/ACT nasal spray Place 2 sprays into the nose daily as needed for rhinitis. 16 g 3  . ibuprofen (ADVIL,MOTRIN) 200 MG tablet Take 200 mg by mouth every 6 (six) hours as needed for pain.    . mometasone (ELOCON) 0.1 % lotion APP EXT TO THE SCALP  AS NEEDED  5  . promethazine (PHENERGAN) 12.5 MG tablet Take 1 tablet (12.5 mg total) by mouth every 6 (six) hours as needed for nausea or vomiting. 30 tablet 0  . sertraline (ZOLOFT) 50 MG tablet TK 1 T PO QD  3  . sertraline (ZOLOFT) 50 MG tablet Take 1 tablet (50 mg total) by mouth daily. 30 tablet 0   No current facility-administered medications on file prior to visit.     ALLERGIES: No Known Allergies  FAMILY HISTORY: Family History  Problem Relation Age of Onset  .  Stroke Mother [id number]  . Hypothyroidism Mother   . Heart disease Father        stent & bypass  . Alcohol abuse Unknown        maternal & paternal grandfather, & sister  . Prostate cancer Paternal Uncle   . Multiple sclerosis Brother   . Hypertension Neg Hx   . Diabetes Neg  Hx   . Colon cancer Neg Hx    ***.  SOCIAL HISTORY: Social History   Socioeconomic History  . Marital status: Married    Spouse name: Not on file  . Number of children: Not on file  . Years of education: Not on file  . Highest education level: Not on file  Occupational History  . Not on file  Social Needs  . Financial resource strain: Not on file  . Food insecurity:    Worry: Not on file    Inability: Not on file  . Transportation needs:    Medical: Not on file    Non-medical: Not on file  Tobacco Use  . Smoking status: Never Smoker  . Smokeless tobacco: Never Used  Substance and Sexual Activity  . Alcohol use: No  . Drug use: No  . Sexual activity: Not on file  Lifestyle  . Physical activity:    Days per week: Not on file    Minutes per session: Not on file  . Stress: Not on file  Relationships  . Social connections:    Talks on phone: Not on file    Gets together: Not on file    Attends religious service: Not on file    Active member of club or organization: Not on file    Attends meetings of clubs or organizations: Not on file    Relationship status: Not on file  . Intimate partner violence:    Fear of current or ex partner: Not on file    Emotionally abused: Not on file    Physically abused: Not on file    Forced sexual activity: Not on file  Other Topics Concern  . Not on file  Social History Narrative  . Not on file    REVIEW OF SYSTEMS: Constitutional: No fevers, chills, or sweats, no generalized fatigue, change in appetite Eyes: No visual changes, double vision, eye pain Ear, nose and throat: No hearing loss, ear pain, nasal congestion, sore throat Cardiovascular: No chest pain, palpitations Respiratory:  No shortness of breath at rest or with exertion, wheezes GastrointestinaI: No nausea, vomiting, diarrhea, abdominal pain, fecal incontinence Genitourinary:  No dysuria, urinary retention or frequency Musculoskeletal:  No neck pain, back  pain Integumentary: No rash, pruritus, skin lesions Neurological: as above Psychiatric: No depression, insomnia, anxiety Endocrine: No palpitations, fatigue, diaphoresis, mood swings, change in appetite, change in weight, increased thirst Hematologic/Lymphatic:  No purpura, petechiae. Allergic/Immunologic: no itchy/runny eyes, nasal congestion, recent allergic reactions, rashes  PHYSICAL EXAM: There were no vitals filed for this visit. General: No acute distress.  Patient appears ***-groomed.  *** body habitus. Head:  Normocephalic/atraumatic Eyes:  Fundi examined but not visualized Neck: supple, no paraspinal tenderness, full range of motion Heart:  Regular rate and rhythm Lungs:  Clear to auscultation bilaterally Back: No paraspinal tenderness Neurological Exam: alert and oriented to person, place, and time. Attention span and concentration intact, recent and remote memory intact, fund of knowledge intact.  Speech fluent and not dysarthric, language intact.  CN II-XII intact. Bulk and tone normal, muscle strength 5/5 throughout.  Sensation to light  touch, temperature and vibration intact.  Deep tendon reflexes 2+ throughout, toes downgoing.  Finger to nose and heel to shin testing intact.  Gait normal, Romberg negative.  IMPRESSION: ***  PLAN: ***  Shon MilletAdam Jaffe, DO  CC: ***

## 2018-04-19 ENCOUNTER — Ambulatory Visit: Payer: BLUE CROSS/BLUE SHIELD | Admitting: Neurology

## 2018-05-03 ENCOUNTER — Other Ambulatory Visit: Payer: Self-pay

## 2018-05-03 MED ORDER — BUTALBITAL-ASA-CAFF-CODEINE 50-325-40-30 MG PO CAPS: 1.0000 | ORAL_CAPSULE | Freq: Three times a day (TID) | ORAL | 0 refills | Status: DC | PRN

## 2018-05-03 MED ORDER — SERTRALINE HCL 50 MG PO TABS: 50.0000 mg | ORAL_TABLET | Freq: Every day | ORAL | 0 refills | Status: DC

## 2018-05-03 NOTE — Telephone Encounter (Signed)
Called pt to schedule appt. LVM for pt to return call

## 2018-05-09 NOTE — Progress Notes (Signed)
NEUROLOGY FOLLOW UP OFFICE NOTE  Bradley Richards [id number]  HISTORY OF PRESENT ILLNESS: Bradley Richards is a 52 year old male who follows up for migraine and primary exertional headache.    UPDATE: Last visit, he was started on Emgality.  He has been on it for 3 months.  The first month, he had more frequent headaches but since then: Intensity:  5-6/10 Duration:  2 hours Rescue protocol:  First ibuprofen, 2nd Excedrin or right to Fiorinal Frequency:  15 headache days a month (down by about 10 days) Frequency of abortive medication:  15 days a month Current NSAIDS:  no Current analgesics:  Excedrin Migraine first line, Fiorinal with Codeine (last resort) Current triptans:  no Current ergotamine:  no Current anti-emetic:  no Current muscle relaxants:  no Current anti-anxiolytic:  no Current sleep aide:  no Current Antihypertensive medications:  no Current Antidepressant medications:  Sertraline 50mg  (higher doses caused dizziness) Current Anticonvulsant medications:  no Current anti-CGRP:  Emgality Current Vitamins/Herbal/Supplements:  no Current Antihistamines/Decongestants:  no Other therapy:  no  Caffeine:  tea Alcohol:  no Smoker:  no Diet:  Hydrates.  No meats/nitrates. Exercise:  Walks.  Plays sports with his children. Depression:  no; Anxiety:  no Other pain:  no Sleep hygiene:  varies  HISTORY: In early-mid 2016, he began having mild to moderate bifrontal daily headache.  Then in December 2016, he had a new headache, described as severe 7/10 bifrontal pounding headache, associated with nausea, vomiting, dizziness, diaphoresis and phonophobia.  There is accompanying blurred vision.  It would last all day.  They steadily became more frequent and intense, occurring next in March, then June, July, August and then 3 weeks later.  Triggers include heat, hunger, lack of sleep and exertion.  Drinking lots of water helps.  He also reports headache on exertion, usually in  the heat, such as playing with his children or during intercourse.  Past NSAIDS:  Indomethacin, ibuprofen, Toradol injection (in ED) Past analgesics:  BCs Past abortive triptans:  sumatriptan 50mg  (caused diarrhea), Zomig 5mg  NS (ineffective) Past muscle relaxants:  no Past anti-emetic:  Phenergan  Past antihypertensive medications:  timolol Past antidepressant medications:  Venlafaxine XR 150mg , amitriptyline Past anticonvulsant medications:  topiramate 25mg  twice daily Past vitamins/Herbal/Supplements:  no  No prior history of headache  MRI of brain without contrast was performed on 10/09/16, which was negative.  MRA of head from 01/23/17 was negative.   PAST MEDICAL HISTORY: Past Medical History:  Diagnosis Date  . Depression    no counseling  . Head ache     MEDICATIONS: Current Outpatient Medications on File Prior to Visit  Medication Sig Dispense Refill  . Aspirin-Acetaminophen-Caffeine (EXCEDRIN MIGRAINE PO) Take by mouth as needed.     . butalbital-aspirin-caffeine-codeine (FIORINAL WITH CODEINE) 50-325-40-30 MG capsule Take 1 capsule by mouth every 8 (eight) hours as needed for migraine. 10 capsule 0  . fluticasone (FLONASE) 50 MCG/ACT nasal spray Place 2 sprays into the nose daily as needed for rhinitis. 16 g 3  . ibuprofen (ADVIL,MOTRIN) 200 MG tablet Take 200 mg by mouth every 6 (six) hours as needed for pain.    . mometasone (ELOCON) 0.1 % lotion APP EXT TO THE SCALP  AS NEEDED  5  . promethazine (PHENERGAN) 12.5 MG tablet Take 1 tablet (12.5 mg total) by mouth every 6 (six) hours as needed for nausea or vomiting. 30 tablet 0  . sertraline (ZOLOFT) 50 MG tablet TK 1 T PO QD  3  . sertraline (ZOLOFT) 50 MG tablet Take 1 tablet (50 mg total) by mouth daily. 30 tablet 0   No current facility-administered medications on file prior to visit.     ALLERGIES: No Known Allergies  FAMILY HISTORY: Family History  Problem Relation Age of Onset  . Stroke Mother 36  .  Hypothyroidism Mother   . Heart disease Father        stent & bypass  . Alcohol abuse Unknown        maternal & paternal grandfather, & sister  . Prostate cancer Paternal Uncle   . Multiple sclerosis Brother   . Hypertension Neg Hx   . Diabetes Neg Hx   . Colon cancer Neg Hx     SOCIAL HISTORY: Social History   Socioeconomic History  . Marital status: Married    Spouse name: Not on file  . Number of children: Not on file  . Years of education: Not on file  . Highest education level: Not on file  Occupational History  . Not on file  Social Needs  . Financial resource strain: Not on file  . Food insecurity:    Worry: Not on file    Inability: Not on file  . Transportation needs:    Medical: Not on file    Non-medical: Not on file  Tobacco Use  . Smoking status: Never Smoker  . Smokeless tobacco: Never Used  Substance and Sexual Activity  . Alcohol use: No  . Drug use: No  . Sexual activity: Not on file  Lifestyle  . Physical activity:    Days per week: Not on file    Minutes per session: Not on file  . Stress: Not on file  Relationships  . Social connections:    Talks on phone: Not on file    Gets together: Not on file    Attends religious service: Not on file    Active member of club or organization: Not on file    Attends meetings of clubs or organizations: Not on file    Relationship status: Not on file  . Intimate partner violence:    Fear of current or ex partner: Not on file    Emotionally abused: Not on file    Physically abused: Not on file    Forced sexual activity: Not on file  Other Topics Concern  . Not on file  Social History Narrative  . Not on file    REVIEW OF SYSTEMS: Constitutional: No fevers, chills, or sweats, no generalized fatigue, change in appetite Eyes: No visual changes, double vision, eye pain Ear, nose and throat: No hearing loss, ear pain, nasal congestion, sore throat Cardiovascular: No chest pain,  palpitations Respiratory:  No shortness of breath at rest or with exertion, wheezes GastrointestinaI: No nausea, vomiting, diarrhea, abdominal pain, fecal incontinence Genitourinary:  No dysuria, urinary retention or frequency Musculoskeletal:  No neck pain, back pain Integumentary: No rash, pruritus, skin lesions Neurological: as above Psychiatric: No depression, insomnia, anxiety Endocrine: No palpitations, fatigue, diaphoresis, mood swings, change in appetite, change in weight, increased thirst Hematologic/Lymphatic:  No purpura, petechiae. Allergic/Immunologic: no itchy/runny eyes, nasal congestion, recent allergic reactions, rashes  PHYSICAL EXAM: Blood pressure 122/80, pulse 74, height 5' 9.5" (1.765 m), weight 201 lb (91.2 kg), SpO2 97 %. General: No acute distress.  Patient appears well-groomed.  normal body habitus. Head:  Normocephalic/atraumatic Eyes:  Fundi examined but not visualized Neck: supple, no paraspinal tenderness, full range of motion Heart:  Regular  rate and rhythm Lungs:  Clear to auscultation bilaterally Back: No paraspinal tenderness Neurological Exam: alert and oriented to person, place, and time. Attention span and concentration intact, recent and remote memory intact, fund of knowledge intact.  Speech fluent and not dysarthric, language intact.  CN II-XII intact. Bulk and tone normal, muscle strength 5/5 throughout.  Sensation to light touch  intact.  Deep tendon reflexes 2+ throughout.  Finger to nose testing intact.  Gait normal, Romberg negative.  IMPRESSION: Chronic migraine without aura, without status migrainosus, not intractable  PLAN: 1.  I will switch from Emgality to Aimovig 70mg  monthly 2.  For abortive therapy:  Ibuprofen first line, Excedrin (or Fiorinal) second line, Fiorinal third line 3.  Limit use of pain relievers to no more than 2 days out of week to prevent risk of rebound or medication-overuse headache. 4.  Keep headache diary 5.   Follow up in 6 months.  Shon Millet, DO  CC: Arva Chafe, DO

## 2018-05-10 ENCOUNTER — Encounter: Payer: Self-pay | Admitting: Neurology

## 2018-05-10 ENCOUNTER — Ambulatory Visit: Payer: BLUE CROSS/BLUE SHIELD | Admitting: Neurology

## 2018-05-10 VITALS — BP 122/80 | HR 74 | Ht 69.5 in | Wt 201.0 lb

## 2018-05-10 DIAGNOSIS — G43709 Chronic migraine without aura, not intractable, without status migrainosus: Secondary | ICD-10-CM | POA: Diagnosis not present

## 2018-05-10 MED ORDER — ERENUMAB-AOOE 70 MG/ML ~~LOC~~ SOAJ: 70.0000 mg | SUBCUTANEOUS | 0 refills | Status: AC

## 2018-05-10 NOTE — Patient Instructions (Signed)
1.  We will stop Emgality and instead try Aimovig 70mg  2.  For abortive therapy:  Ibuprofen first line, Excedrin (or Fiorinal) second line, Fiorinal third line 3.  Limit use of pain relievers to no more than 2 days out of week to prevent risk of rebound or medication-overuse headache. 4.  Keep headache diary 5.  Follow up in 6 months.

## 2018-05-30 ENCOUNTER — Other Ambulatory Visit: Payer: Self-pay

## 2018-05-30 MED ORDER — BUTALBITAL-ASA-CAFF-CODEINE 50-325-40-30 MG PO CAPS: 1.0000 | ORAL_CAPSULE | Freq: Three times a day (TID) | ORAL | 0 refills | Status: DC | PRN

## 2018-05-31 ENCOUNTER — Other Ambulatory Visit: Payer: Self-pay | Admitting: *Deleted

## 2018-05-31 ENCOUNTER — Telehealth: Payer: Self-pay | Admitting: Neurology

## 2018-05-31 MED ORDER — SERTRALINE HCL 50 MG PO TABS: 50.0000 mg | ORAL_TABLET | Freq: Every day | ORAL | 0 refills | Status: AC

## 2018-05-31 MED ORDER — BUTALBITAL-ASA-CAFF-CODEINE 50-325-40-30 MG PO CAPS: 1.0000 | ORAL_CAPSULE | Freq: Three times a day (TID) | ORAL | 0 refills | Status: DC | PRN

## 2018-05-31 NOTE — Telephone Encounter (Signed)
Patient would like call back when this is called in. He states that My Chart is not working right now. He states that the pharmacy on [address] in [place] has not gotten anything from [name].  Please make this as the primary pharmacy if it is not already he said .  We were to call in the Butalbital and sertaline

## 2018-05-31 NOTE — Telephone Encounter (Addendum)
Called to let Pt know Rx's have been faxed in, [place]

## 2018-06-04 ENCOUNTER — Ambulatory Visit: Attending: Acute Care | Primary: Family Medicine

## 2018-06-04 ENCOUNTER — Ambulatory Visit: Admit: 2018-06-04 | Discharge: 2018-06-04 | Payer: BLUE CROSS/BLUE SHIELD | Attending: Acute Care | Primary: Family

## 2018-06-04 DIAGNOSIS — G43109 Migraine with aura, not intractable, without status migrainosus: Secondary | ICD-10-CM

## 2018-06-04 MED ORDER — CODEINE-BUTALBITAL-ASA-CAFFEINE 30 MG-50 MG-325 MG-40 MG CAP
30-50-325-40 mg | ORAL_CAPSULE | Freq: Three times a day (TID) | ORAL | 4 refills | Status: DC | PRN
Start: 2018-06-04 — End: 2018-07-14

## 2018-06-04 MED ORDER — ERENUMAB-AOOE 70 MG/ML SUBCUTANEOUS AUTO-INJECTOR
70 mg/mL | SUBCUTANEOUS | 11 refills | Status: DC
Start: 2018-06-04 — End: 2018-07-14

## 2018-06-04 NOTE — Progress Notes (Signed)
Patient: Benjamin [name]  MRN: [id number]  DOB: 1966-03-23      CC: Headaches    HPI:   @ is a 52 y.o.yo male here for new patient evaluation for headaches.  Referred by   Dr. Rushie Goltz, Kieth Brightly, MD     Patient relocated to [place] from [place], [place] recently and needs to establish with neurology.  Locally he was seen by Dr Junita Push once- who patient is no longer able to see.  He was under the care of neurology in [place] for years. I have reviewed his most recent neuro note from his neurologist in [place].     Headaches are located bifrontal and radiate posterior.  They began about 2.5 years ago, they have been controlled with less severity over the last few months.   The current frequency of headaches: 10 HA days/month sometimes as more than one headache per day; sometimes 15 headaches per month .  Duration: 4 hours or more.  Severity is 4-5/10, previously was 8-9/10. Quality of headaches are described as constant sharp pain.  Associated symptoms: nausea, emesis, phonosensitivity, no associated lateralized numbness or weakness, his wife reported that on occasion he did not enunciate very well, dizziness, no jaw pain, no vision pain .  The headaches are exacerbated by heat, noise,  stress/ working at computer .   Factors that relieve the headaches are excedrin or Goody's, rest in a quiet place, fiorinal if needed ( uses this last line therapy). 6 months ago his headaches were severe and he was missing 4-5 days of work / month, having to go to bed, emesis, with migraine.       Family Members with headache history: none    Current Medications used for HA treatment: excedrin migraine, occasional Goody's, occasional fiorinal and aimovig 70 mg x 1 injection     Medications tried in the past: Emgality, Indomethacin, ibuprofen, Toradol injection (in ED) sumatriptan 50mg  (caused diarrhea), Zomig 5mg  NS (ineffective)timolol  Venlafaxine XR 150mg , amitriptyline,  topiramate 25mg  twice daily    Associated medical problems: sleep is poor,  he is able to go to sleep but awakens after a few hours, melatonin helps. Depression - controlled with zoloft 50 mg monthly.     Previous Imaging: MRI of brain without contrast was performed on 10/09/16, which was negative. MRA of head from 01/23/17 was negative.    He is good about proper hydration.  He eliminated nitrates and peanut/peanut oil from his diet.       Review of Systems   Review of Systems   Constitutional: Negative.    HENT: Negative.    Eyes: Negative.    Respiratory: Negative.    Cardiovascular: Negative.    Gastrointestinal: Negative.    Genitourinary: Negative.    Musculoskeletal: Negative.    Skin: Negative.    Neurological: Positive for headaches.   Endo/Heme/Allergies: Negative.    Psychiatric/Behavioral: Negative.        Past Medical History:  Past Medical History:   Diagnosis Date   ??? Depression 2013    mild   ??? Migraines 2013     No past surgical history on file.  Family History   Problem Relation Age of Onset   ??? Stroke Mother         mild   ??? Thyroid Disease Mother         hypothyroidism   ??? Heart Failure Mother    ??? Heart Disease Father    ??? Heart Attack Father    ???  Other Maternal Grandmother         angina   ??? No Known Problems Maternal Grandfather    ??? Cancer Paternal Grandmother    ??? Cancer Paternal Grandfather    ??? Other Sister 66        Staph infection/ Guillian Barre   ??? Other Brother         quadraplegic due to MVA   ??? MS Brother    ??? No Known Problems Son    ??? No Known Problems Son    ??? No Known Problems Son    ??? No Known Problems Daughter        Current Medications:  Current Outpatient Medications   Medication Sig Dispense Refill   ??? erenumab-aooe (AIMOVIG) 70 mg/mL injection 70 mg by SubCUTAneous route once.     ??? sertraline (ZOLOFT) 50 mg tablet Take 50 mg by mouth daily.     ??? codeine-butalbital-aspirin-caffeine (FIORINAL-CODEINE #3) 30-50-325-40 mg per capsule Take 1 Cap by mouth every eight (8) hours as needed for Pain. Max Daily Amount: 3 Caps. 60 Cap 0       Allergies:  No  Known Allergies    Social History:  Social History     Socioeconomic History   ??? Marital status: MARRIED     Spouse name: Not on file   ??? Number of children: Not on file   ??? Years of education: Not on file   ??? Highest education level: Not on file   Tobacco Use   ??? Smoking status: Never Smoker   ??? Smokeless tobacco: Never Used   Substance and Sexual Activity   ??? Alcohol use: No   ??? Drug use: No   ??? Sexual activity: Yes     Partners: Female     Birth control/protection: Surgical       Family History:  Family History   Problem Relation Age of Onset   ??? Stroke Mother         mild   ??? Thyroid Disease Mother         hypothyroidism   ??? Heart Failure Mother    ??? Heart Disease Father    ??? Heart Attack Father    ??? Other Maternal Grandmother         angina   ??? No Known Problems Maternal Grandfather    ??? Cancer Paternal Grandmother    ??? Cancer Paternal Grandfather    ??? Other Sister 61        Staph infection/ Guillian Barre   ??? Other Brother         quadraplegic due to MVA   ??? MS Brother    ??? No Known Problems Son    ??? No Known Problems Son    ??? No Known Problems Son    ??? No Known Problems Daughter        Vital Signs:  Visit Vitals  BP 121/87 (BP 1 Location: Right arm, BP Patient Position: Sitting)   Pulse 71   Ht 5\' 9"  (1.753 m)   Wt 198 lb (89.8 kg)   BMI 29.24 kg/m??       Physical Exam:  General:  No acute distress.  Head: atraumatic. normocephalic.  Heart: Regular rate/rhythm; without murmur  Respiratory: non-labored respirations, without cough/audible wheeze  Neurological:  Mental Status: Alert and oriented x 3  Cranial Nerves:   I- deferred  II, III, IV, VI-Pupils equal, round and reactive to light.  Extraocular movements intact.  Visual  fields full.   V-Facial sensation intact to light touch V1-V3  VII-Face symmetric  VIII-Hearing intact to finger rub  IX-deferred  X, XII-Speech clear and fluent. Tongue midline  XI-Shoulder shrug strong and symmetric    Sensation: intact to light touch in bilateral upper and lower  extremities  Strength: normal tone.  5/5 strength in bilateral deltoids, biceps, triceps, interossei, hip flexors, knee extensors, knee flexors, ankle flexion and dorsiflexion.  Without tremor.   Cerebellar function: Finger-to-nose bilateral upper extremities intact.  Negative Romberg  Reflexes: deep tendon reflexes- 2+ bilateral biceps, brachioradialis, triceps, patellar, ankle jerks.  Toes: downgoing bilaterally  Gait: casual and tandem steady      Assessment/ Plan:    Diagnoses and all orders for this visit:    1. Migraine with aura and without status migrainosus, not intractable  Assessment & Plan:  He has a normal neurologic exam.  Headaches are consistent with migraines, experiencing 10 HA days per month, sometimes 2 HA per day; 15 HA per month. Severity at this time is reduced from previous months, making them easier to treat. He would like to reduce the HA frequency.   His recent MRI/MRA brain done in 2018 was "normal" by review of previous neurology notes - I am not able to view images or report in care everywhere.  We discussed the importance of regular, adequate sleep and he will again try melatonin to help him sleep longer hours.  He endorses good hydration.  He admits he may be using Goody's Powder a little too much, but less than 3 x per week.  I advised him to try to discontinue use.  He is already taking magnesium 800 mg daily.   At this point we will give him his second Aimovig 70 mg SQ injection today in the office and continue monthly injections.   Refill fiorinal - he is only to use if excedrin migraine does not work.  Has tried triptans both oral and nasal without good results.   He will follow up with me in 3 months time or sooner if new or worsening symptoms.     aimovig 70 mg SQ to L arm, second monthly injection given  LOT [id number]  EXP 12/20     Orders:  -     codeine-butalbital-aspirin-caffeine (FIORINAL-CODEINE #3) 30-50-325-40 mg per capsule; Take 1 Cap by mouth every eight (8) hours as  needed for Pain for up to 30 days. Max Daily Amount: 3 Caps.    Other orders  -     erenumab-aooe (AIMOVIG AUTOINJECTOR) 70 mg/mL injection; 1 mL by SubCUTAneous route every thirty (30) days.        Headache Education:   Instructed the patient on over-the-counter headache management medications including magnesium.   To avoid a pain medication overuse headache trying not to take pain medicines more than 3 doses a week. To help relieve headache symptoms without taking pain medicine lie down under darkroom and drink glass of water.  Consider lifestyle modification including good sleep hygiene, routine medial schedules, regular exercise and managing triggers.  Keep a headache diary  to reveal triggers and possible patterns.  Triggers may be: Food, stress, perfumes, alcohol, or even chocolate.  Drink plenty of water and try to get 8 hours of sleep each night to reduce risk factors that may cause headaches.    This was a 50 minute new patient visit. Greater than 50% of time was spent in education of medication, preventive and acute headache treatment, headache education,  discussion of possible headache triggers.     Cristine Polio NP  [facility]  [address]  [place], [place] [address]  Phone: [phone]  Fax: [phone]

## 2018-06-04 NOTE — Assessment & Plan Note (Signed)
He has a normal neurologic exam.  Headaches are consistent with migraines, experiencing 10 HA days per month, sometimes 2 HA per day; 15 HA per month. Severity at this time is reduced from previous months, making them easier to treat. He would like to reduce the HA frequency.   His recent MRI/MRA brain done in 2018 was "normal" by review of previous neurology notes - I am not able to view images or report in care everywhere.  We discussed the importance of regular, adequate sleep and he will again try melatonin to help him sleep longer hours.  He endorses good hydration.  He admits he may be using Goody's Powder a little too much, but less than 3 x per week.  I advised him to try to discontinue use.  He is already taking magnesium 800 mg daily.   At this point we will give him his second Aimovig 70 mg SQ injection today in the office and continue monthly injections.   Refill fiorinal - he is only to use if excedrin migraine does not work.  Has tried triptans both oral and nasal without good results.   He will follow up with me in 3 months time or sooner if new or worsening symptoms.     aimovig 70 mg SQ to L arm, second monthly injection given  LOT [id number]  EXP 12/20

## 2018-06-04 NOTE — Patient Instructions (Signed)
Migraine Headache: Care Instructions  Your Care Instructions  Migraines are painful, throbbing headaches that often start on one side of the head. They may cause nausea and vomiting and make you sensitive to light, sound, or smell.  Without treatment, migraines can last from 4 hours to a few days. Medicines can help prevent migraines or stop them after they have started. Your doctor can help you find which ones work best for you.  Follow-up care is a key part of your treatment and safety. Be sure to make and go to all appointments, and call your doctor if you are having problems. It's also a good idea to know your test results and keep a list of the medicines you take.  How can you care for yourself at home?  ?? Do not drive if you have taken a prescription pain medicine.  ?? Rest in a quiet, dark room until your headache is gone. Close your eyes, and try to relax or go to sleep. Don't watch TV or read.  ?? Put a cold, moist cloth or cold pack on the painful area for 10 to 20 minutes at a time. Put a thin cloth between the cold pack and your skin.  ?? Use a warm, moist towel or a heating pad set on low to relax tight shoulder and neck muscles.  ?? Have someone gently massage your neck and shoulders.  ?? Take your medicines exactly as prescribed. Call your doctor if you think you are having a problem with your medicine. You will get more details on the specific medicines your doctor prescribes.  ?? Be careful not to take pain medicine more often than the instructions allow. You could get worse or more frequent headaches when the medicine wears off.  To prevent migraines  ?? Keep a headache diary so you can figure out what triggers your headaches. Avoiding triggers may help you prevent headaches. Record when each headache began, how long it lasted, and what the pain was like. (Was it throbbing, aching, stabbing, or dull?) Write down any other symptoms  you had with the headache, such as nausea, flashing lights or dark spots, or sensitivity to bright light or loud noise. Note if the headache occurred near your period. List anything that might have triggered the headache. Triggers may include certain foods (chocolate, cheese, wine) or odors, smoke, bright light, stress, or lack of sleep.  ?? If your doctor has prescribed medicine for your migraines, take it as directed. You may have medicine that you take only when you get a migraine and medicine that you take all the time to help prevent migraines.  ? If your doctor has prescribed medicine for when you get a headache, take it at the first sign of a migraine, unless your doctor has given you other instructions.  ? If your doctor has prescribed medicine to prevent migraines, take it exactly as prescribed. Call your doctor if you think you are having a problem with your medicine.  ?? Find healthy ways to deal with stress. Migraines are most common during or right after stressful times. Take time to relax before and after you do something that has caused a migraine in the past.  ?? Try to keep your muscles relaxed by keeping good posture. Check your jaw, face, neck, and shoulder muscles for tension. Try to relax them. When you sit at a desk, change positions often. And make sure to stretch for 30 seconds each hour.  ?? Get plenty of sleep and   exercise.  ?? Eat meals on a regular schedule. Avoid foods and drinks that often trigger migraines. These include chocolate, alcohol (especially red wine and port), aspartame, monosodium glutamate (MSG), and some additives found in foods (such as hot dogs, bacon, cold cuts, aged cheeses, and pickled foods).  ?? Limit caffeine. Don't drink too much coffee, tea, or soda. But don't quit caffeine suddenly. That can also give you migraines.  ?? Do not smoke or allow others to smoke around you. If you need help quitting, talk to your doctor about stop-smoking programs and medicines.  These can increase your chances of quitting for good.  ?? If you are taking birth control pills or hormone therapy, talk to your doctor about whether they are triggering your migraines.  When should you call for help?  Call 911 anytime you think you may need emergency care. For example, call if:  ?? ?? You have signs of a stroke. These may include:  ? Sudden numbness, paralysis, or weakness in your face, arm, or leg, especially on only one side of your body.  ? Sudden vision changes.  ? Sudden trouble speaking.  ? Sudden confusion or trouble understanding simple statements.  ? Sudden problems with walking or balance.  ? A sudden, severe headache that is different from past headaches.   ??Call your doctor now or seek immediate medical care if:  ?? ?? You have new or worse nausea and vomiting.   ?? ?? You have a new or higher fever.   ?? ?? Your headache gets much worse.   ??Watch closely for changes in your health, and be sure to contact your doctor if:  ?? ?? You are not getting better after 2 days (48 hours).   Where can you learn more?  Go to http://www.healthwise.net/[employer].  Enter U690 in the search box to learn more about "Migraine Headache: Care Instructions."  Current as of: November 29, 2017  Content Version: 12.2  ?? 2006-2019 [employer]. Care instructions adapted under license by [employer] (which disclaims liability or warranty for this information). If you have questions about a medical condition or this instruction, always ask your healthcare professional. [employer] disclaims any warranty or liability for your use of this information.

## 2018-06-04 NOTE — Assessment & Plan Note (Addendum)
He has a normal neurologic exam.  Headaches are consistent with migraines, experiencing 10 HA days per month, sometimes 2 HA per day; 15 HA per month. Severity at this time is reduced from previous months, making them easier to treat. He would like to reduce the HA frequency.   His recent MRI/MRA brain done in 2018 was "normal" by review of previous neurology notes - I am not able to view images or report in care everywhere.  We discussed the importance of regular, adequate sleep and he will again try melatonin to help him sleep longer hours.  He endorses good hydration.  He admits he may be using Goody's Powder a little too much, but less than 3 x per week.  I advised him to try to discontinue use.  He is already taking magnesium 800 mg daily.   At this point we will give him his second Aimovig 70 mg SQ injection today in the office and continue monthly injections.   Refill fiorinal - he is only to use if excedrin migraine does not work.  Has tried triptans both oral and nasal without good results.   He will follow up with me in 3 months time or sooner if new or worsening symptoms.     aimovig 70 mg SQ to L arm, second monthly injection given  LOT [id number]  EXP 12/20

## 2018-06-04 NOTE — Progress Notes (Signed)
Patient: Benjamin [name]  MRN: [id number]  DOB: [birth year]      CC: Headaches    HPI:   @ is a 52 y.o.yo male here for new patient evaluation for headaches.  Referred by   Dr.Leland, Kieth Brightly, MD    Patient relocated to [place] from [place], [place] recently and needs to establish with neurology.  Locally he was seen by Dr Junita Push once- who patient is no longer able to see.  He was under the care of neurology in [place] for years. I have reviewed his most recent neuro note from his neurologist in [place].     Headaches are located bifrontal and radiate posterior.  They began about 2.5 years ago, they have been controlled with less severity over the last few months.   The current frequency of headaches: 10 HA days/month sometimes as more than one headache per day; sometimes 15 headaches per month .  Duration: 4 hours or more.  Severity is 4-5/10, previously was 8-9/10. Quality of headaches are described as constant sharp pain.  Associated symptoms: nausea, emesis, phonosensitivity, no associated lateralized numbness or weakness, his wife reported that on occasion he did not enunciate very well, dizziness, no jaw pain, no vision pain .  The headaches are exacerbated by heat, noise,  stress/ working at computer .   Factors that relieve the headaches are excedrin or Goody's, rest in a quiet place, fiorinal if needed ( uses this last line therapy). 6 months ago his headaches were severe and he was missing 4-5 days of work / month, having to go to bed, emesis, with migraine.       Family Members with headache history: none    Current Medications used for HA treatment: excedrin migraine, occasional Goody's, occasional fiorinal and aimovig 70 mg x 1 injection     Medications tried in the past: Emgality, Indomethacin, ibuprofen, Toradol injection (in ED) sumatriptan 50mg  (caused diarrhea), Zomig 5mg  NS (ineffective)timolol  Venlafaxine XR 150mg , amitriptyline,  topiramate 25mg  twice daily     Associated medical problems: sleep is poor, he is able to go to sleep but awakens after a few hours, melatonin helps. Depression - controlled with zoloft 50 mg monthly.     Previous Imaging: MRI of brain without contrast was performed on 10/09/16, which was negative. MRA of head from 01/23/17 was negative.    He is good about proper hydration.  He eliminated nitrates and peanut/peanut oil from his diet.       Review of Systems   Review of Systems   Constitutional: Negative.    HENT: Negative.    Eyes: Negative.    Respiratory: Negative.    Cardiovascular: Negative.    Gastrointestinal: Negative.    Genitourinary: Negative.    Musculoskeletal: Negative.    Skin: Negative.    Neurological: Positive for headaches.   Endo/Heme/Allergies: Negative.    Psychiatric/Behavioral: Negative.        Past Medical History:  Past Medical History:   Diagnosis Date   ??? Depression 2013    mild   ??? Migraines 2013     No past surgical history on file.  Family History   Problem Relation Age of Onset   ??? Stroke Mother         mild   ??? Thyroid Disease Mother         hypothyroidism   ??? Heart Failure Mother    ??? Heart Disease Father    ??? Heart Attack Father    ??? Other  Maternal Grandmother         angina   ??? No Known Problems Maternal Grandfather    ??? Cancer Paternal Grandmother    ??? Cancer Paternal Grandfather    ??? Other Sister [id number]        Staph infection/ Guillian Barre   ??? Other Brother         quadraplegic due to MVA   ??? MS Brother    ??? No Known Problems Son    ??? No Known Problems Son    ??? No Known Problems Son    ??? No Known Problems Daughter        Current Medications:  Current Outpatient Medications   Medication Sig Dispense Refill   ??? erenumab-aooe (AIMOVIG) 70 mg/mL injection 70 mg by SubCUTAneous route once.     ??? sertraline (ZOLOFT) 50 mg tablet Take 50 mg by mouth daily.     ??? codeine-butalbital-aspirin-caffeine (FIORINAL-CODEINE #3) 30-50-325-40 mg per capsule Take 1 Cap by mouth every eight (8) hours as needed for  Pain. Max Daily Amount: 3 Caps. 60 Cap 0       Allergies:  No Known Allergies    Social History:  Social History     Socioeconomic History   ??? Marital status: MARRIED     Spouse name: Not on file   ??? Number of children: Not on file   ??? Years of education: Not on file   ??? Highest education level: Not on file   Tobacco Use   ??? Smoking status: Never Smoker   ??? Smokeless tobacco: Never Used   Substance and Sexual Activity   ??? Alcohol use: No   ??? Drug use: No   ??? Sexual activity: Yes     Partners: Female     Birth control/protection: Surgical       Family History:  Family History   Problem Relation Age of Onset   ??? Stroke Mother         mild   ??? Thyroid Disease Mother         hypothyroidism   ??? Heart Failure Mother    ??? Heart Disease Father    ??? Heart Attack Father    ??? Other Maternal Grandmother         angina   ??? No Known Problems Maternal Grandfather    ??? Cancer Paternal Grandmother    ??? Cancer Paternal Grandfather    ??? Other Sister [id number]        Staph infection/ Guillian Barre   ??? Other Brother         quadraplegic due to MVA   ??? MS Brother    ??? No Known Problems Son    ??? No Known Problems Son    ??? No Known Problems Son    ??? No Known Problems Daughter        Vital Signs:  Visit Vitals  BP 121/87 (BP 1 Location: Right arm, BP Patient Position: Sitting)   Pulse 71   Ht 5\' 9"  (1.753 m)   Wt 198 lb (89.8 kg)   BMI 29.24 kg/m??       Physical Exam:  General:  No acute distress.  Head: atraumatic. normocephalic.  Heart: Regular rate/rhythm; without murmur  Respiratory: non-labored respirations, without cough/audible wheeze  Neurological:  Mental Status: Alert and oriented x 3  Cranial Nerves:   I- deferred  II, III, IV, VI-Pupils equal, round and reactive to light.  Extraocular movements intact.  Visual fields  full.   V-Facial sensation intact to light touch V1-V3  VII-Face symmetric  VIII-Hearing intact to finger rub  IX-deferred  X, XII-Speech clear and fluent. Tongue midline  XI-Shoulder shrug strong and symmetric     Sensation: intact to light touch in bilateral upper and lower extremities  Strength: normal tone.  5/5 strength in bilateral deltoids, biceps, triceps, interossei, hip flexors, knee extensors, knee flexors, ankle flexion and dorsiflexion.  Without tremor.   Cerebellar function: Finger-to-nose bilateral upper extremities intact.  Negative Romberg  Reflexes: deep tendon reflexes- 2+ bilateral biceps, brachioradialis, triceps, patellar, ankle jerks.  Toes: downgoing bilaterally  Gait: casual and tandem steady      Assessment/ Plan:    Diagnoses and all orders for this visit:    1. Migraine with aura and without status migrainosus, not intractable  Assessment & Plan:  He has a normal neurologic exam.  Headaches are consistent with migraines, experiencing 10 HA days per month, sometimes 2 HA per day; 15 HA per month. Severity at this time is reduced from previous months, making them easier to treat. He would like to reduce the HA frequency.   His recent MRI/MRA brain done in 2018 was "normal" by review of previous neurology notes - I am not able to view images or report in care everywhere.  We discussed the importance of regular, adequate sleep and he will again try melatonin to help him sleep longer hours.  He endorses good hydration.  He admits he may be using Goody's Powder a little too much, but less than 3 x per week.  I advised him to try to discontinue use.  He is already taking magnesium 800 mg daily.   At this point we will give him his second Aimovig 70 mg SQ injection today in the office and continue monthly injections.   Refill fiorinal - he is only to use if excedrin migraine does not work.  Has tried triptans both oral and nasal without good results.   He will follow up with me in 3 months time or sooner if new or worsening symptoms.     aimovig 70 mg SQ to L arm, second monthly injection given  LOT [id number]  EXP 12/20     Orders:  -     codeine-butalbital-aspirin-caffeine (FIORINAL-CODEINE #3)  30-50-325-40 mg per capsule; Take 1 Cap by mouth every eight (8) hours as needed for Pain for up to 30 days. Max Daily Amount: 3 Caps.    Other orders  -     erenumab-aooe (AIMOVIG AUTOINJECTOR) 70 mg/mL injection; 1 mL by SubCUTAneous route every thirty (30) days.        Headache Education:   Instructed the patient on over-the-counter headache management medications including magnesium.   To avoid a pain medication overuse headache trying not to take pain medicines more than 3 doses a week. To help relieve headache symptoms without taking pain medicine lie down under darkroom and drink glass of water.  Consider lifestyle modification including good sleep hygiene, routine medial schedules, regular exercise and managing triggers.  Keep a headache diary  to reveal triggers and possible patterns.  Triggers may be: Food, stress, perfumes, alcohol, or even chocolate.  Drink plenty of water and try to get 8 hours of sleep each night to reduce risk factors that may cause headaches.    This was a 50 minute new patient visit. Greater than 50% of time was spent in education of medication, preventive and acute headache treatment, headache education, discussion  of possible headache triggers.     Cristine Polio NP  [facility]  [address]  [place], [place] [address]  Phone: [phone]  Fax: [phone]

## 2018-06-28 ENCOUNTER — Other Ambulatory Visit: Payer: Self-pay

## 2018-06-28 MED ORDER — BUTALBITAL-ASA-CAFF-CODEINE 50-325-40-30 MG PO CAPS: 1.0000 | ORAL_CAPSULE | Freq: Three times a day (TID) | ORAL | 0 refills | Status: DC | PRN

## 2018-07-14 ENCOUNTER — Encounter

## 2018-07-15 MED ORDER — CODEINE-BUTALBITAL-ASA-CAFFEINE 30 MG-50 MG-325 MG-40 MG CAP
30-50-325-40 mg | ORAL_CAPSULE | Freq: Three times a day (TID) | ORAL | 4 refills | Status: AC | PRN
Start: 2018-07-15 — End: 2018-08-14

## 2018-07-15 MED ORDER — ERENUMAB-AOOE 70 MG/ML SUBCUTANEOUS AUTO-INJECTOR
70 mg/mL | SUBCUTANEOUS | 11 refills | Status: DC
Start: 2018-07-15 — End: 2018-11-08

## 2018-07-19 NOTE — Telephone Encounter (Signed)
PA for aimovig 70mg /ml approved from 07/19/18 to 2.13.2020. Ref: [id number]

## 2018-08-04 ENCOUNTER — Other Ambulatory Visit: Payer: Self-pay

## 2018-08-06 MED ORDER — BUTALBITAL-ASA-CAFF-CODEINE 50-325-40-30 MG PO CAPS: 1.0000 | ORAL_CAPSULE | Freq: Three times a day (TID) | ORAL | 0 refills | Status: DC | PRN

## 2018-08-26 ENCOUNTER — Other Ambulatory Visit: Payer: Self-pay

## 2018-08-26 MED ORDER — BUTALBITAL-ASA-CAFF-CODEINE 50-325-40-30 MG PO CAPS: 1.0000 | ORAL_CAPSULE | Freq: Three times a day (TID) | ORAL | 0 refills | Status: AC | PRN

## 2018-09-09 ENCOUNTER — Encounter: Attending: Acute Care | Primary: Family

## 2018-09-10 ENCOUNTER — Ambulatory Visit: Attending: Acute Care | Primary: Family Medicine

## 2018-09-10 ENCOUNTER — Ambulatory Visit: Admit: 2018-09-10 | Discharge: 2018-09-10 | Payer: BLUE CROSS/BLUE SHIELD | Attending: Acute Care | Primary: Family

## 2018-09-10 DIAGNOSIS — G43109 Migraine with aura, not intractable, without status migrainosus: Secondary | ICD-10-CM

## 2018-09-10 MED ORDER — CODEINE-BUTALBITAL-ASA-CAFFEINE 30 MG-50 MG-325 MG-40 MG CAP
30-50-325-40 mg | ORAL_CAPSULE | Freq: Four times a day (QID) | ORAL | 4 refills | Status: AC | PRN
Start: 2018-09-10 — End: 2018-10-10

## 2018-09-10 NOTE — Assessment & Plan Note (Signed)
Migraines improved with 2 aimovig injections - we will try to facilitate with his new insurance plan so he can continue aimovig 70 mg monthly.  Continue fiorinol sparingly when needed.  No longer using goodys.  Occasional  Insomnia, using melatonin. He is staying hydrated except for one instance of stomach virus that triggered a migraine,

## 2018-09-10 NOTE — Progress Notes (Signed)
Patient: Benjamin [name]  MRN: [id number]  DOB: 24-Nov-1965    CC: Headaches    HPI:   @ is a 53 y.o.yo male here for migraine headache follow up. Last office visit his  frequency of headaches: 10 HA days/month sometimes as more than one headache per day; sometimes 15 headaches per month .?? Duration: 4 hours or more.??He only had one aimovig injection 70 mg last office visit and we continued those for headache prevention.  He does not respond well to triptans and uses fiorinal for acute headache relief. He also uses magnesium daily.  He had a total of 2 aimovig injections and had some reduction in the frequency of headaches - unfortunately his  Insurance changed and he was not able to continue aimovig - our office will work on that.     Medications tried in the past: Emgality, Indomethacin, ibuprofen, Toradol injection (in ED) sumatriptan 50mg  (caused diarrhea), Zomig 5mg  NS (ineffective)timolol  Venlafaxine XR 150mg , amitriptyline,  topiramate 25mg  twice daily  ??  Associated medical problems: sleep is poor, he is able to go to sleep but awakens after a few hours, melatonin helps. Depression - controlled with zoloft 50 mg monthly.   ??  Previous Imaging: MRI of brain without contrast was performed on 10/09/16, which was negative. MRA of head from 01/23/17 was negative.          Review of Systems   Review of Systems   Gastrointestinal: Positive for nausea and vomiting.   Neurological: Positive for headaches.   Psychiatric/Behavioral: The patient has insomnia.    All other systems reviewed and are negative.      Past Medical History:  Past Medical History:   Diagnosis Date   ??? Depression 2013    mild   ??? Migraines 2013     No past surgical history on file.  Family History   Problem Relation Age of Onset   ??? Stroke Mother         mild   ??? Thyroid Disease Mother         hypothyroidism   ??? Heart Failure Mother    ??? Heart Disease Father    ??? Heart Attack Father    ??? Other Maternal Grandmother         angina   ??? No Known Problems  Maternal Grandfather    ??? Cancer Paternal Grandmother    ??? Cancer Paternal Grandfather    ??? Other Sister [id number]        Staph infection/ Guillian Barre   ??? Other Brother         quadraplegic due to MVA   ??? MS Brother    ??? No Known Problems Son    ??? No Known Problems Son    ??? No Known Problems Son    ??? No Known Problems Daughter        Current Medications:  Current Outpatient Medications   Medication Sig Dispense Refill   ??? codeine-butalbital-aspirin-caffeine (FIORINAL-CODEINE #3) 30-50-325-40 mg per capsule Take  by mouth every six (6) hours as needed for Pain.     ??? erenumab-aooe (AIMOVIG AUTOINJECTOR) 70 mg/mL injection 1 mL by SubCUTAneous route every thirty (30) days. 1 Each 11   ??? sertraline (ZOLOFT) 50 mg tablet Take 50 mg by mouth daily.         Allergies:  No Known Allergies    Social History:  Social History     Socioeconomic History   ??? Marital status: MARRIED  Spouse name: Not on file   ??? Number of children: Not on file   ??? Years of education: Not on file   ??? Highest education level: Not on file   Tobacco Use   ??? Smoking status: Never Smoker   ??? Smokeless tobacco: Never Used   Substance and Sexual Activity   ??? Alcohol use: No   ??? Drug use: No   ??? Sexual activity: Yes     Partners: Female     Birth control/protection: Surgical       Family History:  Family History   Problem Relation Age of Onset   ??? Stroke Mother         mild   ??? Thyroid Disease Mother         hypothyroidism   ??? Heart Failure Mother    ??? Heart Disease Father    ??? Heart Attack Father    ??? Other Maternal Grandmother         angina   ??? No Known Problems Maternal Grandfather    ??? Cancer Paternal Grandmother    ??? Cancer Paternal Grandfather    ??? Other Sister 41        Staph infection/ Guillian Barre   ??? Other Brother         quadraplegic due to MVA   ??? MS Brother    ??? No Known Problems Son    ??? No Known Problems Son    ??? No Known Problems Son    ??? No Known Problems Daughter        Vital Signs:  Visit Vitals  BP 122/90   Pulse 85   Ht 5\' 9"  (1.753  m)   Wt 203 lb 3.2 oz (92.2 kg)   BMI 30.01 kg/m??       Physical Exam:  General:  No acute distress.  Head: atraumatic. normocephalic.  Heart: Regular rate/rhythm; without murmur  Respiratory: non-labored respirations, without cough/audible wheeze  Neurological:  Mental Status: Alert and oriented x 3  Cranial Nerves:   I- deferred  II, III, IV, VI-Pupils equal, round and reactive to light.  Extraocular movements intact.  Visual fields full.   V-Facial sensation intact to light touch V1-V3  VII-Face symmetric  VIII-Hearing intact to finger rub  IX-deferred  X, XII-Speech clear and fluent. Tongue midline  XI-Shoulder shrug strong and symmetric    Sensation: intact to light touch in bilateral upper and lower extremities  Strength: normal tone.  5/5 strength in bilateral deltoids, biceps, triceps, interossei, hip flexors, knee extensors, knee flexors, ankle flexion and dorsiflexion.  Without tremor.   Cerebellar function: Finger-to-nose bilateral upper extremities intact.    Reflexes: deep tendon reflexes- 2+ bilateral biceps, brachioradialis, triceps, patellar (right patellar deferred due to right knee injury) , ankle jerks.    Gait: casual steady      Assessment/ Plan:    Diagnoses and all orders for this visit:    1. Migraine with aura and without status migrainosus, not intractable  Assessment & Plan:  Migraines improved with 2 aimovig injections - we will try to facilitate with his new insurance plan so he can continue aimovig 70 mg monthly.  Continue fiorinol sparingly when needed.  No longer using goodys.  Occasional  Insomnia, using melatonin. He is staying hydrated except for one instance of stomach virus that triggered a migraine,     Orders:  -     codeine-butalbital-aspirin-caffeine (FIORINAL-CODEINE #3) 30-50-325-40 mg per capsule; Take 1 Cap by mouth  every six (6) hours as needed for Pain for up to 30 days. Max Daily Amount: 4 Caps.                Cristine Polio NP  [facility]  [address]  [place], [place] [address]  Phone: [phone]  Fax: [phone]

## 2018-09-10 NOTE — Assessment & Plan Note (Signed)
Migraines improved with 2 aimovig injections - we will try to facilitate with his new insurance plan so he can continue aimovig 70 mg monthly.  Continue fiorinol sparingly when needed.  No longer using goodys.  Occasional  Insomnia, using melatonin. He is staying hydrated except for one instance of stomach virus that triggered a migraine,

## 2018-09-10 NOTE — Progress Notes (Signed)
Patient: Benjamin [name]  MRN: [id number]  DOB: 08-20-1966    CC: Headaches    HPI:   @ is a 53 y.o.yo male here for migraine headache follow up. Last office visit his  frequency of headaches: 10 HA days/month sometimes as more than one headache per day; sometimes 15 headaches per month .?? Duration: 4 hours or more.??He only had one aimovig injection 70 mg last office visit and we continued those for headache prevention.  He does not respond well to triptans and uses fiorinal for acute headache relief. He also uses magnesium daily.  He had a total of 2 aimovig injections and had some reduction in the frequency of headaches - unfortunately his  Insurance changed and he was not able to continue aimovig - our office will work on that.     Medications tried in the past: Emgality, Indomethacin, ibuprofen, Toradol injection (in ED) sumatriptan 50mg  (caused diarrhea), Zomig 5mg  NS (ineffective)timolol  Venlafaxine XR 150mg , amitriptyline,  topiramate 25mg  twice daily  ??  Associated medical problems: sleep is poor, he is able to go to sleep but awakens after a few hours, melatonin helps. Depression - controlled with zoloft 50 mg monthly.   ??  Previous Imaging: MRI of brain without contrast was performed on 10/09/16, which was negative. MRA of head from 01/23/17 was negative.          Review of Systems   Review of Systems   Gastrointestinal: Positive for nausea and vomiting.   Neurological: Positive for headaches.   Psychiatric/Behavioral: The patient has insomnia.    All other systems reviewed and are negative.      Past Medical History:  Past Medical History:   Diagnosis Date   ??? Depression 2013    mild   ??? Migraines 2013     No past surgical history on file.  Family History   Problem Relation Age of Onset   ??? Stroke Mother         mild   ??? Thyroid Disease Mother         hypothyroidism   ??? Heart Failure Mother    ??? Heart Disease Father    ??? Heart Attack Father    ??? Other Maternal Grandmother         angina    ??? No Known Problems Maternal Grandfather    ??? Cancer Paternal Grandmother    ??? Cancer Paternal Grandfather    ??? Other Sister 54        Staph infection/ Guillian Barre   ??? Other Brother         quadraplegic due to MVA   ??? MS Brother    ??? No Known Problems Son    ??? No Known Problems Son    ??? No Known Problems Son    ??? No Known Problems Daughter        Current Medications:  Current Outpatient Medications   Medication Sig Dispense Refill   ??? codeine-butalbital-aspirin-caffeine (FIORINAL-CODEINE #3) 30-50-325-40 mg per capsule Take  by mouth every six (6) hours as needed for Pain.     ??? erenumab-aooe (AIMOVIG AUTOINJECTOR) 70 mg/mL injection 1 mL by SubCUTAneous route every thirty (30) days. 1 Each 11   ??? sertraline (ZOLOFT) 50 mg tablet Take 50 mg by mouth daily.         Allergies:  No Known Allergies    Social History:  Social History     Socioeconomic History   ??? Marital status: MARRIED  Spouse name: Not on file   ??? Number of children: Not on file   ??? Years of education: Not on file   ??? Highest education level: Not on file   Tobacco Use   ??? Smoking status: Never Smoker   ??? Smokeless tobacco: Never Used   Substance and Sexual Activity   ??? Alcohol use: No   ??? Drug use: No   ??? Sexual activity: Yes     Partners: Female     Birth control/protection: Surgical       Family History:  Family History   Problem Relation Age of Onset   ??? Stroke Mother         mild   ??? Thyroid Disease Mother         hypothyroidism   ??? Heart Failure Mother    ??? Heart Disease Father    ??? Heart Attack Father    ??? Other Maternal Grandmother         angina   ??? No Known Problems Maternal Grandfather    ??? Cancer Paternal Grandmother    ??? Cancer Paternal Grandfather    ??? Other Sister 33        Staph infection/ Guillian Barre   ??? Other Brother         quadraplegic due to MVA   ??? MS Brother    ??? No Known Problems Son    ??? No Known Problems Son    ??? No Known Problems Son    ??? No Known Problems Daughter        Vital Signs:  Visit Vitals  BP 122/90    Pulse 85   Ht 5\' 9"  (1.753 m)   Wt 203 lb 3.2 oz (92.2 kg)   BMI 30.01 kg/m??       Physical Exam:  General:  No acute distress.  Head: atraumatic. normocephalic.  Heart: Regular rate/rhythm; without murmur  Respiratory: non-labored respirations, without cough/audible wheeze  Neurological:  Mental Status: Alert and oriented x 3  Cranial Nerves:   I- deferred  II, III, IV, VI-Pupils equal, round and reactive to light.  Extraocular movements intact.  Visual fields full.   V-Facial sensation intact to light touch V1-V3  VII-Face symmetric  VIII-Hearing intact to finger rub  IX-deferred  X, XII-Speech clear and fluent. Tongue midline  XI-Shoulder shrug strong and symmetric    Sensation: intact to light touch in bilateral upper and lower extremities  Strength: normal tone.  5/5 strength in bilateral deltoids, biceps, triceps, interossei, hip flexors, knee extensors, knee flexors, ankle flexion and dorsiflexion.  Without tremor.   Cerebellar function: Finger-to-nose bilateral upper extremities intact.    Reflexes: deep tendon reflexes- 2+ bilateral biceps, brachioradialis, triceps, patellar (right patellar deferred due to right knee injury) , ankle jerks.    Gait: casual steady      Assessment/ Plan:    Diagnoses and all orders for this visit:    1. Migraine with aura and without status migrainosus, not intractable  Assessment & Plan:  Migraines improved with 2 aimovig injections - we will try to facilitate with his new insurance plan so he can continue aimovig 70 mg monthly.  Continue fiorinol sparingly when needed.  No longer using goodys.  Occasional  Insomnia, using melatonin. He is staying hydrated except for one instance of stomach virus that triggered a migraine,     Orders:  -     codeine-butalbital-aspirin-caffeine (FIORINAL-CODEINE #3) 30-50-325-40 mg per capsule; Take 1 Cap by mouth  every six (6) hours as needed for Pain for up to 30 days. Max Daily Amount: 4 Caps.                Cristine PolioFaye Franci Oshana NP   [facility]  [address]  [place], [place] [id number]  Phone: [phone]  Fax: [phone]

## 2018-09-27 ENCOUNTER — Other Ambulatory Visit: Payer: Self-pay

## 2018-09-27 MED ORDER — BUTALBITAL-ASA-CAFF-CODEINE 50-325-40-30 MG PO CAPS: 1.0000 | ORAL_CAPSULE | Freq: Three times a day (TID) | ORAL | 0 refills | Status: DC | PRN

## 2018-10-11 NOTE — Telephone Encounter (Signed)
aimovig approved PA Case [id number]

## 2018-10-11 NOTE — Telephone Encounter (Signed)
Called pt to inform him of Aimovig PA approval.

## 2018-10-29 ENCOUNTER — Other Ambulatory Visit: Payer: Self-pay

## 2018-10-29 MED ORDER — BUTALBITAL-ASA-CAFF-CODEINE 50-325-40-30 MG PO CAPS: 1.0000 | ORAL_CAPSULE | Freq: Three times a day (TID) | ORAL | 0 refills | Status: AC | PRN

## 2018-11-04 NOTE — Telephone Encounter (Signed)
PT is requesting an appointment, due to having migraine episodes

## 2018-11-06 NOTE — Progress Notes (Deleted)
NEUROLOGY FOLLOW UP OFFICE NOTE  Bradley Richards [id number]  HISTORY OF PRESENT ILLNESS: Bradley Richards is a 53 year old man who follows up for migraine and primary exertional headache.  UPDATE: Last visit, he was started on Emgality.  He has been on it for 3 months.  The first month, he had more frequent headaches but since then: Intensity:  5-6/10 Duration:  2 hours Rescue protocol:  First ibuprofen, 2nd Excedrin or right to Fiorinal Frequency:  15 headache days a month (down by about 10 days) Frequency of abortive medication:  15 days a month Current NSAIDS:  no Current analgesics:  Excedrin Migraine first line, Fiorinal with Codeine (last resort) Current triptans:  no Current ergotamine:  no Current anti-emetic:  no Current muscle relaxants:  no Current anti-anxiolytic:  no Current sleep aide:  no Current Antihypertensive medications:  no Current Antidepressant medications:  Sertraline 50mg  (higher doses caused dizziness) Current Anticonvulsant medications:  no Current anti-CGRP:   Aimovig 70 mg Current Vitamins/Herbal/Supplements:  no Current Antihistamines/Decongestants:  no Other therapy:  no  Caffeine:  tea Alcohol:  no Smoker:  no Diet:  Hydrates.  No meats/nitrates. Exercise:  Walks.  Plays sports with his children. Depression:  no; Anxiety:  no Other pain:  no Sleep hygiene:  varies  HISTORY: In early to mid 2016, he began having mild to moderate bifrontal daily headaches.  Then in December 2016, he had a new headache, described as severe 7/10 bifrontal pounding headache, associated with nausea, vomiting, dizziness, diaphoresis and phonophobia. There is accompanying blurred vision. It would last all day. They steadily became more frequent and intense, occurring next in March, then June, July, August and then 3 weeks later. Agrees include heat, hunger, lack of sleep and exertion.    Drinking lots of water helps.  He also reports headache on exertion,  usually in the heat, such as playing with his children or during intercourse.  Past NSAIDS: Indomethacin, ibuprofen, Toradol injection (in ED) Past analgesics:BCs Past abortive triptans: sumatriptan 50mg  (caused diarrhea), Zomig 5mg  NS (ineffective) Past muscle relaxants: no Past anti-emetic: Phenergan  Past antihypertensive medications: timolol Past antidepressant medications:Venlafaxine XR 150mg , amitriptyline Past anticonvulsant medications: topiramate 25mg  twice daily Past anti--CGRP: Emgality Past vitamins/Herbal/Supplements: no  No prior history of headache  MRI of brain without contrast was performed on 10/09/16, which was negative. MRA of head from 01/23/17 was negative.  PAST MEDICAL HISTORY: Past Medical History:  Diagnosis Date  . Depression    no counseling  . Head ache     MEDICATIONS: Current Outpatient Medications on File Prior to Visit  Medication Sig Dispense Refill  . Aspirin-Acetaminophen-Caffeine (EXCEDRIN MIGRAINE PO) Take by mouth as needed.     . butalbital-aspirin-caffeine-codeine (FIORINAL WITH CODEINE) 50-325-40-30 MG capsule Take 1 capsule by mouth every 8 (eight) hours as needed for migraine. 10 capsule 0  . butalbital-aspirin-caffeine-codeine (FIORINAL/CODEINE #3) 50-325-40-30 MG capsule Take 1 capsule by mouth every 8 (eight) hours as needed for pain. 10 capsule 0  . Erenumab-aooe (AIMOVIG) 70 MG/ML SOAJ Inject 70 mg into the skin every 30 (thirty) days. 1 pen 0  . fluticasone (FLONASE) 50 MCG/ACT nasal spray Place 2 sprays into the nose daily as needed for rhinitis. (Patient not taking: Reported on 05/10/2018) 16 g 3  . Galcanezumab-gnlm (EMGALITY) 120 MG/ML SOAJ Inject 120 mg into the skin every 30 (thirty) days.    Marland Kitchen ibuprofen (ADVIL,MOTRIN) 200 MG tablet Take 200 mg by mouth every 6 (six) hours as needed for pain.    Marland Kitchen  mometasone (ELOCON) 0.1 % lotion APP EXT TO THE SCALP  AS NEEDED  5  . promethazine (PHENERGAN) 12.5 MG tablet Take 1  tablet (12.5 mg total) by mouth every 6 (six) hours as needed for nausea or vomiting. (Patient not taking: Reported on 05/10/2018) 30 tablet 0  . sertraline (ZOLOFT) 50 MG tablet TK 1 T PO QD  3  . sertraline (ZOLOFT) 50 MG tablet Take 1 tablet (50 mg total) by mouth daily. 30 tablet 0   No current facility-administered medications on file prior to visit.     ALLERGIES: No Known Allergies  FAMILY HISTORY: Family History  Problem Relation Age of Onset  . Stroke Mother 15  . Hypothyroidism Mother   . Heart disease Father        stent & bypass  . Alcohol abuse Unknown        maternal & paternal grandfather, & sister  . Prostate cancer Paternal Uncle   . Multiple sclerosis Brother   . Hypertension Neg Hx   . Diabetes Neg Hx   . Colon cancer Neg Hx    ***.  SOCIAL HISTORY: Social History   Socioeconomic History  . Marital status: Married    Spouse name: Not on file  . Number of children: Not on file  . Years of education: Not on file  . Highest education level: Not on file  Occupational History  . Not on file  Social Needs  . Financial resource strain: Not on file  . Food insecurity:    Worry: Not on file    Inability: Not on file  . Transportation needs:    Medical: Not on file    Non-medical: Not on file  Tobacco Use  . Smoking status: Never Smoker  . Smokeless tobacco: Never Used  Substance and Sexual Activity  . Alcohol use: No  . Drug use: No  . Sexual activity: Not on file  Lifestyle  . Physical activity:    Days per week: Not on file    Minutes per session: Not on file  . Stress: Not on file  Relationships  . Social connections:    Talks on phone: Not on file    Gets together: Not on file    Attends religious service: Not on file    Active member of club or organization: Not on file    Attends meetings of clubs or organizations: Not on file    Relationship status: Not on file  . Intimate partner violence:    Fear of current or ex partner: Not on file     Emotionally abused: Not on file    Physically abused: Not on file    Forced sexual activity: Not on file  Other Topics Concern  . Not on file  Social History Narrative  . Not on file    REVIEW OF SYSTEMS: Constitutional: No fevers, chills, or sweats, no generalized fatigue, change in appetite Eyes: No visual changes, double vision, eye pain Ear, nose and throat: No hearing loss, ear pain, nasal congestion, sore throat Cardiovascular: No chest pain, palpitations Respiratory:  No shortness of breath at rest or with exertion, wheezes GastrointestinaI: No nausea, vomiting, diarrhea, abdominal pain, fecal incontinence Genitourinary:  No dysuria, urinary retention or frequency Musculoskeletal:  No neck pain, back pain Integumentary: No rash, pruritus, skin lesions Neurological: as above Psychiatric: No depression, insomnia, anxiety Endocrine: No palpitations, fatigue, diaphoresis, mood swings, change in appetite, change in weight, increased thirst Hematologic/Lymphatic:  No purpura, petechiae. Allergic/Immunologic: no  itchy/runny eyes, nasal congestion, recent allergic reactions, rashes  PHYSICAL EXAM: *** General: No acute distress.  Patient appears ***-groomed.  *** body habitus. Head:  Normocephalic/atraumatic Eyes:  Fundi examined but not visualized Neck: supple, no paraspinal tenderness, full range of motion Heart:  Regular rate and rhythm Lungs:  Clear to auscultation bilaterally Back: No paraspinal tenderness Neurological Exam: alert and oriented to person, place, and time. Attention span and concentration intact, recent and remote memory intact, fund of knowledge intact.  Speech fluent and not dysarthric, language intact.  CN II-XII intact. Bulk and tone normal, muscle strength 5/5 throughout.  Sensation to light touch, temperature and vibration intact.  Deep tendon reflexes 2+ throughout, toes downgoing.  Finger to nose and heel to shin testing intact.  Gait normal, Romberg  negative.  IMPRESSION: ***  PLAN: ***  Shon Millet, DO  CC: ***

## 2018-11-08 ENCOUNTER — Ambulatory Visit: Payer: BLUE CROSS/BLUE SHIELD | Admitting: Neurology

## 2018-11-08 ENCOUNTER — Ambulatory Visit: Attending: Acute Care | Primary: Family Medicine

## 2018-11-08 ENCOUNTER — Ambulatory Visit: Admit: 2018-11-08 | Discharge: 2018-11-08 | Payer: BLUE CROSS/BLUE SHIELD | Attending: Acute Care | Primary: Family

## 2018-11-08 DIAGNOSIS — G43109 Migraine with aura, not intractable, without status migrainosus: Secondary | ICD-10-CM

## 2018-11-08 MED ORDER — ONDANSETRON 4 MG TAB, RAPID DISSOLVE
4 mg | ORAL_TABLET | Freq: Three times a day (TID) | ORAL | 1 refills | Status: DC | PRN
Start: 2018-11-08 — End: 2019-01-14

## 2018-11-08 MED ORDER — CODEINE-BUTALBITAL-ASA-CAFFEINE 30 MG-50 MG-325 MG-40 MG CAP
30-50-325-40 mg | ORAL_CAPSULE | Freq: Three times a day (TID) | ORAL | 1 refills | Status: AC | PRN
Start: 2018-11-08 — End: 2018-12-08

## 2018-11-08 MED ORDER — ERENUMAB-AOOE 70 MG/ML SUBCUTANEOUS AUTO-INJECTOR
70 mg/mL | SUBCUTANEOUS | 11 refills | Status: DC
Start: 2018-11-08 — End: 2019-01-14

## 2018-11-08 NOTE — Assessment & Plan Note (Signed)
Patient previously was headache free (migraine) for about one year and in the last 2 months had 2 severe migraines with nausea and vomiting and is now having up to 4 less severe headache days per week associated with nausea.  He denies constipation related to aimovig injections but clearly has developed some GI issues over the last few months. He uses ibuprofen up to 3 days per week and uses fiorinal as second line.  I previously tried cutting back on his fiorinal quantity but he is requiring / requesting an increase in quantity.    1. I have inceased his fiorinal to # 21 tabs per month with 1 refill.  Upon checking the [facility] it appears he received an additional fiorinal 10 tabs since last office visit.  I have discussed with patient that he is to only use our office if he needs medication adjustment, in particular for controlled substances.  Additionally I contacted Dr Moises Blood office Marchelle Folks) and left a detailed message with her requesting Dr Everlena Cooper no longer provide additional fiorinal for the patient; that we have assumed his care.   2. I have provided him with a Ubrevly started pack to try - we discussed proper use, take at onset of headache, not to exceed 200 mg / day.   3. zofran for nausea - if he continues to have abdominal symptoms that are clearly related to his headaches, will change to Ajovy.   4. Consider repeat MRI brain for worsening migraine severity, frequency at next office visit.

## 2018-11-08 NOTE — Progress Notes (Signed)
Patient: Benjamin [name]  MRN: [id number]  DOB: [birth year]    CC: Headaches    HPI:   @ is a 53 y.o.yo male here for migraine headache follow up. He had a recent severe migraine that started with abdominal cramping, bifrontal headache, phonosensitivity, nausea and emesis, lasted for 1.5 days - was treated at urgent care.  Nausea is a new symptom.  He has changed his diet, eliminated cheeses, nitrates, peanut oil.  The severity of his migraines is increased and nausea is new for him.  He has nausea w/ low grade headache, severity 2-3/10 about 3-4 days/week - treats with drinking a lot of water, keeping a good diet. He is able to work and he does not take tylenol..  The more severe migraines are at least monthly now and previously he was well controlled and had gone about a year without a severe migraine.  He uses ibuprofen 3 days per week.  He only uses fiorinal as second line medication if ibuprofen does not work.  He needs something for nausea.    The fiorinal continues to be effective as long as he takes it early enough.     He has worsening frequency and severity of migraines, one requiring him to go to urgent care.       Current Medications used for HA treatment: aimovig, fiorinal    Medications tried in the past: emgality, indomethacin, ibuprofen, toradol injection, sumatriptan (diarrhea ), zomig-not effective, timolol, venlafaxine, amitriptyline     Associated medical problems:sleep is poor, he is able to go to sleep but awakens after a few hours, melatonin helps. Depression - controlled with zoloft 50 mg monthly.    Previous Imaging: Previous Neurologist Dr Adriana Mccallum note of 05/10/2018 notes MRI/MRA brain was nromal       Review of Systems   Review of Systems   Constitutional: Negative.    HENT: Negative.    Eyes: Negative.    Respiratory: Negative.    Cardiovascular: Positive for palpitations.   Gastrointestinal: Positive for abdominal pain, nausea and vomiting.   Genitourinary: Negative.    Musculoskeletal: Negative.     Skin: Negative.    Neurological: Positive for dizziness and headaches.   Endo/Heme/Allergies: Negative.    Psychiatric/Behavioral: The patient is nervous/anxious and has insomnia.        Past Medical History:  Past Medical History:   Diagnosis Date   ??? Depression 2013    mild   ??? Migraines 2013     History reviewed. No pertinent surgical history.  Family History   Problem Relation Age of Onset   ??? Stroke Mother         mild   ??? Thyroid Disease Mother         hypothyroidism   ??? Heart Failure Mother    ??? Heart Disease Father    ??? Heart Attack Father    ??? Other Maternal Grandmother         angina   ??? No Known Problems Maternal Grandfather    ??? Cancer Paternal Grandmother    ??? Cancer Paternal Grandfather    ??? Other Sister 75        Staph infection/ Guillian Barre   ??? Other Brother         quadraplegic due to MVA   ??? MS Brother    ??? No Known Problems Son    ??? No Known Problems Son    ??? No Known Problems Son    ??? No Known Problems  Daughter        Current Medications:  Current Outpatient Medications   Medication Sig Dispense Refill   ??? erenumab-aooe (AIMOVIG AUTOINJECTOR) 70 mg/mL injection 1 mL by SubCUTAneous route every thirty (30) days. 1 Each 11   ??? sertraline (ZOLOFT) 50 mg tablet Take 50 mg by mouth daily.         Allergies:  No Known Allergies    Social History:  Social History     Socioeconomic History   ??? Marital status: MARRIED     Spouse name: Not on file   ??? Number of children: Not on file   ??? Years of education: Not on file   ??? Highest education level: Not on file   Tobacco Use   ??? Smoking status: Never Smoker   ??? Smokeless tobacco: Never Used   Substance and Sexual Activity   ??? Alcohol use: No   ??? Drug use: No   ??? Sexual activity: Yes     Partners: Female     Birth control/protection: Surgical       Family History:  Family History   Problem Relation Age of Onset   ??? Stroke Mother         mild   ??? Thyroid Disease Mother         hypothyroidism   ??? Heart Failure Mother    ??? Heart Disease Father    ??? Heart  Attack Father    ??? Other Maternal Grandmother         angina   ??? No Known Problems Maternal Grandfather    ??? Cancer Paternal Grandmother    ??? Cancer Paternal Grandfather    ??? Other Sister 80        Staph infection/ Guillian Barre   ??? Other Brother         quadraplegic due to MVA   ??? MS Brother    ??? No Known Problems Son    ??? No Known Problems Son    ??? No Known Problems Son    ??? No Known Problems Daughter        Vital Signs:  Visit Vitals  BP 126/84   Pulse 76   Ht  (1.753 m)   Wt 201 lb (91.2 kg)   SpO2 97%   BMI 29.68 kg/m??       Physical Exam:  General:  No acute distress.  Head: atraumatic. normocephalic.  Heart: Regular rate/rhythm; without murmur  Respiratory: non-labored respirations, without cough/audible wheeze  Neurological:  Mental Status: Alert and oriented x 3  Cranial Nerves:   I- deferred  II, III, IV, VI-Pupils equal, round and reactive to light.  Extraocular movements intact.  Visual fields full.   V-Facial sensation intact to light touch V1-V3  VII-Face symmetric  VIII-Hearing intact to finger rub  IX-deferred  X, XII-Speech clear and fluent. Tongue midline  XI-Shoulder shrug strong and symmetric    Sensation: intact to light touch in bilateral upper and lower extremities  Strength: normal tone.  5/5 strength in bilateral deltoids, biceps, triceps, interossei, hip flexors, knee extensors, knee flexors, ankle flexion and dorsiflexion.  Without tremor.   Cerebellar function: Finger-to-nose bilateral upper extremities intact.  Negative Romberg  Reflexes: deep tendon reflexes- 2+ bilateral biceps, brachioradialis, 3+patellar, ankle jerks.  Toes: downgoing bilaterally  Gait: casual and tandem steady      Assessment/ Plan:    Diagnoses and all orders for this visit:    1. Migraine with aura and without  status migrainosus, not intractable  Assessment & Plan:  Patient previously was headache free (migraine) for about one year and in the last 2 months had 2 severe migraines with nausea and vomiting and  is now having up to 4 less severe headache days per week associated with nausea.  He denies constipation related to aimovig injections but clearly has developed some GI issues over the last few months. He uses ibuprofen up to 3 days per week and uses fiorinal as second line.  I previously tried cutting back on his fiorinal quantity but he is requiring / requesting an increase in quantity.    1. I have inceased his fiorinal to # 21 tabs per month with 1 refill.  Upon checking the [facility] it appears he received an additional fiorinal 10 tabs since last office visit.  I have discussed with patient that he is to only use our office if he needs medication adjustment, in particular for controlled substances.  Additionally I contacted Dr Moises Blood office Marchelle Folks) and left a detailed message with her requesting Dr Everlena Cooper no longer provide additional fiorinal for the patient; that we have assumed his care.   2. I have provided him with a Ubrevly started pack to try - we discussed proper use, take at onset of headache, not to exceed 200 mg / day.   3. zofran for nausea - if he continues to have abdominal symptoms that are clearly related to his headaches, will change to Ajovy.   4. Consider repeat MRI brain for worsening migraine severity, frequency at next office visit.     Orders:  -     codeine-butalbital-aspirin-caffeine (FIORINAL WITH CODEINE) 30-50-325-40 mg per capsule; Take 1 Cap by mouth every eight (8) hours as needed for Pain or Headache for up to 30 days. Max Daily Amount: 3 Caps. Indications: severe migraine    Other orders  -     erenumab-aooe (AIMOVIG AUTOINJECTOR) 70 mg/mL injection; 1 mL by SubCUTAneous route every thirty (30) days.  -     ondansetron (ZOFRAN ODT) 4 mg disintegrating tablet; Take 1 Tab by mouth every eight (8) hours as needed for Nausea or Vomiting.         Greater than 50% of this 25 min office visit was spent in discussion of aimovig side effects, need to track headaches and newly related  abdominal symptoms, discussion of ubrevly and proper use, possible side effects, and future plan of care.         Cristine Polio NP  [facility]  [address] [place]  [place], [place] [address]  Phone: [phone]  Fax: [phone]

## 2018-11-08 NOTE — Progress Notes (Signed)
Patient: Benjamin [name]  MRN: [id number]  DOB: [birth year]    CC: Headaches    HPI:   @ is a 53 y.o.yo male here for migraine headache follow up. He had a recent severe migraine that started with abdominal cramping, bifrontal headache, phonosensitivity, nausea and emesis, lasted for 1.5 days - was treated at urgent care.  Nausea is a new symptom.  He has changed his diet, eliminated cheeses, nitrates, peanut oil.  The severity of his migraines is increased and nausea is new for him.  He has nausea w/ low grade headache, severity 2-3/10 about 3-4 days/week - treats with drinking a lot of water, keeping a good diet. He is able to work and he does not take tylenol..  The more severe migraines are at least monthly now and previously he was well controlled and had gone about a year without a severe migraine.  He uses ibuprofen 3 days per week.  He only uses fiorinal as second line medication if ibuprofen does not work.  He needs something for nausea.    The fiorinal continues to be effective as long as he takes it early enough.     He has worsening frequency and severity of migraines, one requiring him to go to urgent care.       Current Medications used for HA treatment: aimovig, fiorinal    Medications tried in the past: emgality, indomethacin, ibuprofen, toradol injection, sumatriptan (diarrhea ), zomig-not effective, timolol, venlafaxine, amitriptyline     Associated medical problems:sleep is poor, he is able to go to sleep but awakens after a few hours, melatonin helps. Depression - controlled with zoloft 50 mg monthly.    Previous Imaging: Previous Neurologist Dr Adriana Mccallum note of 05/10/2018 notes MRI/MRA brain was nromal       Review of Systems   Review of Systems   Constitutional: Negative.    HENT: Negative.    Eyes: Negative.    Respiratory: Negative.    Cardiovascular: Positive for palpitations.   Gastrointestinal: Positive for abdominal pain, nausea and vomiting.   Genitourinary: Negative.     Musculoskeletal: Negative.    Skin: Negative.    Neurological: Positive for dizziness and headaches.   Endo/Heme/Allergies: Negative.    Psychiatric/Behavioral: The patient is nervous/anxious and has insomnia.        Past Medical History:  Past Medical History:   Diagnosis Date   ??? Depression 2013    mild   ??? Migraines 2013     History reviewed. No pertinent surgical history.  Family History   Problem Relation Age of Onset   ??? Stroke Mother         mild   ??? Thyroid Disease Mother         hypothyroidism   ??? Heart Failure Mother    ??? Heart Disease Father    ??? Heart Attack Father    ??? Other Maternal Grandmother         angina   ??? No Known Problems Maternal Grandfather    ??? Cancer Paternal Grandmother    ??? Cancer Paternal Grandfather    ??? Other Sister 95        Staph infection/ Guillian Barre   ??? Other Brother         quadraplegic due to MVA   ??? MS Brother    ??? No Known Problems Son    ??? No Known Problems Son    ??? No Known Problems Son    ??? No Known Problems  Daughter        Current Medications:  Current Outpatient Medications   Medication Sig Dispense Refill   ??? erenumab-aooe (AIMOVIG AUTOINJECTOR) 70 mg/mL injection 1 mL by SubCUTAneous route every thirty (30) days. 1 Each 11   ??? sertraline (ZOLOFT) 50 mg tablet Take 50 mg by mouth daily.         Allergies:  No Known Allergies    Social History:  Social History     Socioeconomic History   ??? Marital status: MARRIED     Spouse name: Not on file   ??? Number of children: Not on file   ??? Years of education: Not on file   ??? Highest education level: Not on file   Tobacco Use   ??? Smoking status: Never Smoker   ??? Smokeless tobacco: Never Used   Substance and Sexual Activity   ??? Alcohol use: No   ??? Drug use: No   ??? Sexual activity: Yes     Partners: Female     Birth control/protection: Surgical       Family History:  Family History   Problem Relation Age of Onset   ??? Stroke Mother         mild   ??? Thyroid Disease Mother         hypothyroidism   ??? Heart Failure Mother     ??? Heart Disease Father    ??? Heart Attack Father    ??? Other Maternal Grandmother         angina   ??? No Known Problems Maternal Grandfather    ??? Cancer Paternal Grandmother    ??? Cancer Paternal Grandfather    ??? Other Sister 28        Staph infection/ Guillian Barre   ??? Other Brother         quadraplegic due to MVA   ??? MS Brother    ??? No Known Problems Son    ??? No Known Problems Son    ??? No Known Problems Son    ??? No Known Problems Daughter        Vital Signs:  Visit Vitals  BP 126/84   Pulse 76   Ht 5\' 9"  (1.753 m)   Wt 201 lb (91.2 kg)   SpO2 97%   BMI 29.68 kg/m??       Physical Exam:  General:  No acute distress.  Head: atraumatic. normocephalic.  Heart: Regular rate/rhythm; without murmur  Respiratory: non-labored respirations, without cough/audible wheeze  Neurological:  Mental Status: Alert and oriented x 3  Cranial Nerves:   I- deferred  II, III, IV, VI-Pupils equal, round and reactive to light.  Extraocular movements intact.  Visual fields full.   V-Facial sensation intact to light touch V1-V3  VII-Face symmetric  VIII-Hearing intact to finger rub  IX-deferred  X, XII-Speech clear and fluent. Tongue midline  XI-Shoulder shrug strong and symmetric    Sensation: intact to light touch in bilateral upper and lower extremities  Strength: normal tone.  5/5 strength in bilateral deltoids, biceps, triceps, interossei, hip flexors, knee extensors, knee flexors, ankle flexion and dorsiflexion.  Without tremor.   Cerebellar function: Finger-to-nose bilateral upper extremities intact.  Negative Romberg  Reflexes: deep tendon reflexes- 2+ bilateral biceps, brachioradialis, 3+patellar, ankle jerks.  Toes: downgoing bilaterally  Gait: casual and tandem steady      Assessment/ Plan:    Diagnoses and all orders for this visit:    1. Migraine with aura and without  status migrainosus, not intractable  Assessment & Plan:  Patient previously was headache free (migraine) for about one year and in  the last 2 months had 2 severe migraines with nausea and vomiting and is now having up to 4 less severe headache days per week associated with nausea.  He denies constipation related to aimovig injections but clearly has developed some GI issues over the last few months. He uses ibuprofen up to 3 days per week and uses fiorinal as second line.  I previously tried cutting back on his fiorinal quantity but he is requiring / requesting an increase in quantity.    1. I have inceased his fiorinal to # 21 tabs per month with 1 refill.  Upon checking the [place]MP it appears he received an additional fiorinal 10 tabs since last office visit.  I have discussed with patient that he is to only use our office if he needs medication adjustment, in particular for controlled substances.  Additionally I contacted Dr Moises Blood office Marchelle Folks) and left a detailed message with her requesting Dr Everlena Cooper no longer provide additional fiorinal for the patient; that we have assumed his care.   2. I have provided him with a Ubrevly started pack to try - we discussed proper use, take at onset of headache, not to exceed 200 mg / day.   3. zofran for nausea - if he continues to have abdominal symptoms that are clearly related to his headaches, will change to Ajovy.   4. Consider repeat MRI brain for worsening migraine severity, frequency at next office visit.     Orders:  -     codeine-butalbital-aspirin-caffeine (FIORINAL WITH CODEINE) 30-50-325-40 mg per capsule; Take 1 Cap by mouth every eight (8) hours as needed for Pain or Headache for up to 30 days. Max Daily Amount: 3 Caps. Indications: severe migraine    Other orders  -     erenumab-aooe (AIMOVIG AUTOINJECTOR) 70 mg/mL injection; 1 mL by SubCUTAneous route every thirty (30) days.  -     ondansetron (ZOFRAN ODT) 4 mg disintegrating tablet; Take 1 Tab by mouth every eight (8) hours as needed for Nausea or Vomiting.         Greater than 50% of this 25 min office visit was spent in discussion of  aimovig side effects, need to track headaches and newly related abdominal symptoms, discussion of ubrevly and proper use, possible side effects, and future plan of care.         Cristine Polio NP  [facility]  [address] [place]  [place], [place] [address]  Phone: [phone]  Fax: [phone]

## 2018-11-08 NOTE — Telephone Encounter (Signed)
Pt is coming today to see Lucendia Herrlich.

## 2018-11-08 NOTE — Assessment & Plan Note (Signed)
Patient previously was headache free (migraine) for about one year and in the last 2 months had 2 severe migraines with nausea and vomiting and is now having up to 4 less severe headache days per week associated with nausea.  He denies constipation related to aimovig injections but clearly has developed some GI issues over the last few months. He uses ibuprofen up to 3 days per week and uses fiorinal as second line.  I previously tried cutting back on his fiorinal quantity but he is requiring / requesting an increase in quantity.    1. I have inceased his fiorinal to # 21 tabs per month with 1 refill.  Upon checking the [place]CPMP it appears he received an additional fiorinal 10 tabs since last office visit.  I have discussed with patient that he is to only use our office if he needs medication adjustment, in particular for controlled substances.  Additionally I contacted Dr Jaffe's office (Amanda) and left a detailed message with her requesting Dr Jaffe no longer provide additional fiorinal for the patient; that we have assumed his care.   2. I have provided him with a Ubrevly started pack to try - we discussed proper use, take at onset of headache, not to exceed 200 mg / day.   3. zofran for nausea - if he continues to have abdominal symptoms that are clearly related to his headaches, will change to Ajovy.   4. Consider repeat MRI brain for worsening migraine severity, frequency at next office visit.

## 2018-11-12 ENCOUNTER — Encounter: Payer: Self-pay | Admitting: Neurology

## 2018-11-12 NOTE — Telephone Encounter (Signed)
Pt was seen on 11/08/18.

## 2018-11-12 NOTE — Telephone Encounter (Signed)
Benjamin [name] left vm that she needs to know the last time that the Fiorinal was filled at our office?     I spoke with Benjamin [name], advised her that his last refill from [name] was November 08, 2018 for #21 with 1 refill. Benjamin [name] said that since the pt lives in [place], they are going to encourage the pt to only come and see [name]. There is no reason for him to have 2 Neurologist and since he lives in [place], we would probably more convenient for him.

## 2018-11-12 NOTE — Telephone Encounter (Signed)
That's fine - I spoke with Benjamin [name] and he is aware to call me should he need any adjustment to his fiorinal or any other medication.

## 2018-11-14 ENCOUNTER — Telehealth: Payer: Self-pay | Admitting: Neurology

## 2018-11-14 NOTE — Telephone Encounter (Signed)
Patient dismissed from [facility]  by Shon Millet DO, effective 11/12/2018. Dismissal letter was sent by registered mail. js.

## 2018-11-14 NOTE — Telephone Encounter (Signed)
Patient called the office on 11/13/2018 after hours and reached the after hours answering service. He left a message stating he received letter from the office, and wanted to discuss it with the office. He wanted to discuss the meaning of it. I called patient on 11/14/2018 and spoke to him regarding the message he left. He stated that he received the d/c letter via mychart and wanted to know if the letter was going to be sent certified mail. I informed him that it would be receiving a hard copy of the letter by mail. He then inquired to which address it was mailed to. I was unable to confirm due to Epic being down at the time. I did inform the patient it would be sent via mail to the address that is on file in our system. He commented that this was his home/work address and that he didn't want it to go to that address. I informed him that the letter had already been mailed out. He said ok, thank you and we disconnected the call at that time. He did not mention or inquire as to the reason of the discharge he was only concerned about where the letter was going to be mailed.

## 2018-12-05 ENCOUNTER — Encounter: Payer: BLUE CROSS/BLUE SHIELD | Attending: Acute Care | Primary: Family

## 2018-12-22 ENCOUNTER — Encounter

## 2018-12-23 NOTE — Telephone Encounter (Signed)
Patient has refills avaialble on script from last month.

## 2018-12-23 NOTE — Telephone Encounter (Signed)
Hey he has a refill in the rx filled last mth.

## 2018-12-23 NOTE — Telephone Encounter (Signed)
Request from patient via [facility].

## 2018-12-30 ENCOUNTER — Ambulatory Visit: Payer: BLUE CROSS/BLUE SHIELD | Admitting: Neurology

## 2019-01-13 ENCOUNTER — Encounter

## 2019-01-14 ENCOUNTER — Encounter

## 2019-01-14 MED ORDER — AIMOVIG AUTOINJECTOR 70 MG/ML SUBCUTANEOUS AUTO-INJECTOR
70 mg/mL | SUBCUTANEOUS | 11 refills | Status: DC
Start: 2019-01-14 — End: 2019-10-17

## 2019-01-14 MED ORDER — ONDANSETRON 4 MG TAB, RAPID DISSOLVE
4 mg | ORAL_TABLET | Freq: Three times a day (TID) | ORAL | 1 refills | Status: DC | PRN
Start: 2019-01-14 — End: 2019-07-23

## 2019-01-14 NOTE — Telephone Encounter (Signed)
Looks like a FB patient.

## 2019-01-14 NOTE — Telephone Encounter (Signed)
Who is his doctor??

## 2019-01-14 NOTE — Telephone Encounter (Signed)
Bernette Mayers, NP  Maury Dus, CMA   Caller: Unspecified Burgess Estelle, 11:12 PM)   Please ask Dr Sharlet Salina to fill for quantity of 21 tabs and 1 refill. ??Last filled 03/13, Rx was previously per Dr Sharlet Salina, schedule III, I cannot write for this.

## 2019-01-14 NOTE — Telephone Encounter (Signed)
I tried calling patient to discuss his migraines, and how often he is requiring fioricet.  Left voice mail message indicating we will refill requested medication, but I also want to know if he tried [place] yet.  I will ask Dr Sharlet Salina to refill the fioricet with codeine as it is a schedule III medication.  I have checked the [place]CPMP database, last filled 03/13, fioricet with codeine. Has not used other providers since then as requested.

## 2019-01-14 NOTE — Telephone Encounter (Signed)
Hey do we know anything about this guy?? I see no notes at all????

## 2019-01-15 ENCOUNTER — Encounter

## 2019-01-15 MED ORDER — CODEINE-BUTALBITAL-ACETAMINOPHEN-CAFFEINE 30 MG-50 MG-325 MG-40 MG CAP
50-325-40-30 mg | ORAL_CAPSULE | Freq: Three times a day (TID) | ORAL | 3 refills | Status: DC
Start: 2019-01-15 — End: 2019-04-04

## 2019-01-24 ENCOUNTER — Other Ambulatory Visit: Payer: Self-pay | Admitting: Neurology

## 2019-01-30 NOTE — Telephone Encounter (Signed)
PA for codeine-butalbital-acetaminophen-caffeine (FIORICET WITH CODEINE) 50-325-40-30 mg approved. Infromed pharmacy.

## 2019-04-04 ENCOUNTER — Encounter

## 2019-04-04 MED ORDER — CODEINE-BUTALBITAL-ACETAMINOPHEN-CAFFEINE 30 MG-50 MG-325 MG-40 MG CAP
50-325-40-30 mg | ORAL_CAPSULE | ORAL | 1 refills | Status: DC
Start: 2019-04-04 — End: 2019-06-13

## 2019-06-13 ENCOUNTER — Encounter

## 2019-06-13 MED ORDER — CODEINE-BUTALBITAL-ACETAMINOPHEN-CAFFEINE 30 MG-50 MG-325 MG-40 MG CAP
50-325-40-30 mg | ORAL_CAPSULE | ORAL | 0 refills | Status: DC
Start: 2019-06-13 — End: 2019-07-14

## 2019-07-14 ENCOUNTER — Encounter

## 2019-07-14 MED ORDER — CODEINE-BUTALBITAL-ACETAMINOPHEN-CAFFEINE 30 MG-50 MG-325 MG-40 MG CAP
50-325-40-30 mg | ORAL_CAPSULE | ORAL | 0 refills | Status: DC
Start: 2019-07-14 — End: 2019-07-23

## 2019-07-15 NOTE — Telephone Encounter (Signed)
Lmom to schedule follow up with Lucendia Herrlich. Per Dr. Sharlet Salina no more RX refills unless patient is seen by Lucendia Herrlich

## 2019-07-23 ENCOUNTER — Ambulatory Visit: Attending: Acute Care | Primary: Family Medicine

## 2019-07-23 ENCOUNTER — Ambulatory Visit: Admit: 2019-07-23 | Discharge: 2019-07-23 | Payer: BLUE CROSS/BLUE SHIELD | Attending: Acute Care | Primary: Family

## 2019-07-23 DIAGNOSIS — G43109 Migraine with aura, not intractable, without status migrainosus: Secondary | ICD-10-CM

## 2019-07-23 MED ORDER — ONDANSETRON 4 MG TAB, RAPID DISSOLVE
4 mg | ORAL_TABLET | Freq: Three times a day (TID) | ORAL | 1 refills | Status: DC | PRN
Start: 2019-07-23 — End: 2019-12-29

## 2019-07-23 MED ORDER — CODEINE-BUTALBITAL-ACETAMINOPHEN-CAFFEINE 30 MG-50 MG-325 MG-40 MG CAP
50-325-40-30 mg | ORAL_CAPSULE | Freq: Two times a day (BID) | ORAL | 2 refills | Status: AC | PRN
Start: 2019-07-23 — End: 2019-08-22

## 2019-07-23 NOTE — Progress Notes (Signed)
Patient: Benjamin [name]  MRN: [id number]  DOB: [birth year]    CC: Headaches    HPI:   @ is a 53 y.o.yo male here for follow up and continued management of chronic migraine headaches with aura.     Medication changes last office visit: trial of ubrelvy    Headaches were down to 12 per month, which is good for him; however in the last 2 months headaches are almost daily, approximately 25 HA days/month.  HA are not as severe.      Less Severe HA: bifrontal, throbbing and sharp, using fiorinal for these.     More severe migraine: bifrontal, usually in the morning, throbbing/sharp; can last all day, but with fiorinal is 2-3 hours duration; associated with sound sensitivity; dizziness.      He is no longer using [place].     Current HA days per month: 25 HA days/month; 8 of these are the more severe debilitating migraines - uses fiorinal, will take one in the morning and one in the evening.     HA Prophylaxis: January was his last dose of aimovig; he just resumed Aimovig this month. No side effects at this point, no constipation.      Rescue med: fiorinal;     Medications tried in the past: emgality, indomethacin, ibuprofen, toradol injection, sumatriptan (diarrhea ), zomig-not effective, timolol, venlafaxine, amitriptyline     Associated medical problems: sleep is poor, he is able to go to sleep but awakens after a few hours, melatonin helps. Depression - controlled with zoloft 50 mg monthly.    Previous Imaging: Previous Neurologist Dr Adriana Mccallum note of 05/10/2018 notes MRI/MRA brain was nromal           Review of Systems   Review of Systems   Constitutional: Negative.    HENT: Negative.    Eyes: Negative.    Respiratory: Negative.    Cardiovascular: Negative.    Gastrointestinal: Positive for abdominal pain and nausea.   Genitourinary: Negative.    Musculoskeletal: Negative.    Skin: Negative.    Neurological: Positive for dizziness and headaches.   Endo/Heme/Allergies: Negative.    Psychiatric/Behavioral: Positive for  depression.       Past Medical History:  Past Medical History:   Diagnosis Date   ??? Depression 2013    mild   ??? Migraines 2013     No past surgical history on file.  Family History   Problem Relation Age of Onset   ??? Stroke Mother         mild   ??? Thyroid Disease Mother         hypothyroidism   ??? Heart Failure Mother    ??? Heart Disease Father    ??? Heart Attack Father    ??? Other Maternal Grandmother         angina   ??? No Known Problems Maternal Grandfather    ??? Cancer Paternal Grandmother    ??? Cancer Paternal Grandfather    ??? Other Sister 51        Staph infection/ Guillian Barre   ??? Other Brother         quadraplegic due to MVA   ??? MS Brother    ??? No Known Problems Son    ??? No Known Problems Son    ??? No Known Problems Son    ??? No Known Problems Daughter        Current Medications:  Current Outpatient Medications   Medication Sig Dispense Refill   ???  codeine-butalbital-acetaminophen-caffeine (FIORICET WITH CODEINE) 50-325-40-30 mg capsule TAKE 1 CAPSULE BY MOUTH EVERY 8 HOURS. MAX: 3 CAPSULES PER DAY 20 Cap 0   ??? erenumab-aooe (Aimovig Autoinjector) 70 mg/mL injection 1 mL by SubCUTAneous route every thirty (30) days. 1 Each 11   ??? ondansetron (ZOFRAN ODT) 4 mg disintegrating tablet Take 1 Tab by mouth every eight (8) hours as needed for Nausea or Vomiting. 30 Tab 1   ??? sertraline (ZOLOFT) 50 mg tablet Take 50 mg by mouth daily.         Allergies:  No Known Allergies    Social History:  Social History     Socioeconomic History   ??? Marital status: MARRIED     Spouse name: Not on file   ??? Number of children: Not on file   ??? Years of education: Not on file   ??? Highest education level: Not on file   Tobacco Use   ??? Smoking status: Never Smoker   ??? Smokeless tobacco: Never Used   Substance and Sexual Activity   ??? Alcohol use: No   ??? Drug use: No   ??? Sexual activity: Yes     Partners: Female     Birth control/protection: Surgical       Family History:  Family History   Problem Relation Age of Onset   ??? Stroke Mother          mild   ??? Thyroid Disease Mother         hypothyroidism   ??? Heart Failure Mother    ??? Heart Disease Father    ??? Heart Attack Father    ??? Other Maternal Grandmother         angina   ??? No Known Problems Maternal Grandfather    ??? Cancer Paternal Grandmother    ??? Cancer Paternal Grandfather    ??? Other Sister 42        Staph infection/ Guillian Barre   ??? Other Brother         quadraplegic due to MVA   ??? MS Brother    ??? No Known Problems Son    ??? No Known Problems Son    ??? No Known Problems Son    ??? No Known Problems Daughter        Vital Signs:  Visit Vitals  Ht 5\' 9"  (1.753 m)   Wt 202 lb (91.6 kg)   BMI 29.83 kg/m??       Physical Exam:  General:  No acute distress.  Head: atraumatic. normocephalic.  Heart: Regular rate/rhythm; without murmur  Respiratory: non-labored respirations, without cough/audible wheeze  Neurological:  Mental Status: Alert and oriented x 3  Cranial Nerves:   I- deferred  II, III, IV, VI-Pupils equal, round and reactive to light.  Extraocular movements intact.  Visual fields full.   V-Facial sensation intact to light touch V1-V3  VII-Face symmetric  VIII-Hearing intact to finger rub  IX-deferred  X, XII-Speech clear and fluent. Tongue midline  XI-Shoulder shrug strong and symmetric    Sensation: intact to light touch in bilateral upper and lower extremities  Strength: normal tone.  5/5 strength in bilateral deltoids, biceps, interossei, hip flexors, knee extensors, knee flexors, ankle flexion and dorsiflexion.  Without tremor.   Cerebellar function: Finger-to-nose bilateral upper extremities intact.  Negative Romberg  Reflexes: deep tendon reflexes- 2+ bilateral biceps, brachioradialis,  patellar, ankle jerks.    Gait: casual steady      Assessment/ Plan:    Diagnoses  and all orders for this visit:    1. Migraine with aura and without status migrainosus, not intractable  Assessment & Plan:  Neurologic exam is normal.  He is having 25 HA days per month; 8 of these being more severe and debilitating  migraines.  He did start Aimovig 70 mg injections in January but due to insurance he was unable to continue the medication.  We now have his Aimovig PA in place and resumed Aimovig 70 this month.  1.  Continue Aimovig 70 mg injection once monthly for at least 3 consecutive months and then we will reevaluate if we need to increase his dosage, he is not having any side effects at this time and denies constipation.  We discussed that we really need to focus on headache prevention so we can reduce his Fiorinal use.  2.  I am refilling his Fiorinal, 20 tabs per month the prescription was provided by Dr. Amador CunasJeff Benjamin.  I did check the [place] PMP aware site.  3.  We discussed that ultimately we need to wean him off of Fiorinal as he does admit to building up tolerance to the medication and feels like he is requiring higher doses.  In the end I believe this is going to worsen his headaches and he understands.  4.  I have provided him with both Ubrelvy 100 mg tablets, may take 2 tablets daily.  This was a sample and he will let me know if it is effective.  5.  I have also provided him with Nurtec 75 mg sample and he is aware to only use 1 tab per 24 hours.  He will let me know which medication he prefers either [place] or Nurtec and I will provide him with a prescription at that time.  6.  We discussed other migraine preventatives including increasing his Aimovig dose or trying Vyepti infusions.  We can also consider Botox.    He will follow-up with me in 4 months time so we can reassess how he is doing with his Aimovig preventive or he will call me sooner if needed.    Orders:  -     ondansetron (ZOFRAN ODT) 4 mg disintegrating tablet; Take 1 Tab by mouth every eight (8) hours as needed for Nausea or Vomiting.  -     codeine-butalbital-acetaminophen-caffeine (FIORICET WITH CODEINE) 50-325-40-30 mg capsule; Take 1 Cap by mouth two (2) times daily as needed for Headache for up to 30 days. Max Daily Amount: 2  Caps.            I have spent greater than 50% of the patient's 35 minute visit  in counseling for importance of medication adherence, hydration, avoidance of medication overuse, proper use of any rescue and preventive medications, and importance of good sleep hygiene.     Patient is to continue all other medications as directed by prescribing physicians unless addressed above in plan. Continuation of these medications from today's visit are made based on the patient's report of current medications.       Cristine PolioFaye Glenden Rossell NP  [facility]  [address]  [place], [place] [id number]  Phone: [phone]  Fax: [phone]

## 2019-07-23 NOTE — Assessment & Plan Note (Signed)
Neurologic exam is normal.  He is having 25 HA days per month; 8 of these being more severe and debilitating migraines.  He did start Aimovig 70 mg injections in January but due to insurance he was unable to continue the medication.  We now have his Aimovig PA in place and resumed Aimovig 70 this month.  1.  Continue Aimovig 70 mg injection once monthly for at least 3 consecutive months and then we will reevaluate if we need to increase his dosage, he is not having any side effects at this time and denies constipation.  We discussed that we really need to focus on headache prevention so we can reduce his Fiorinal use.  2.  I am refilling his Fiorinal, 20 tabs per month the prescription was provided by Dr. Merry Proud.  I did check the [place] PMP aware site.  3.  We discussed that ultimately we need to wean him off of Fiorinal as he does admit to building up tolerance to the medication and feels like he is requiring higher doses.  In the end I believe this is going to worsen his headaches and he understands.  4.  I have provided him with both Ubrelvy 100 mg tablets, may take 2 tablets daily.  This was a sample and he will let me know if it is effective.  5.  I have also provided him with Nurtec 75 mg sample and he is aware to only use 1 tab per 24 hours.  He will let me know which medication he prefers either [place] or Nurtec and I will provide him with a prescription at that time.  6.  We discussed other migraine preventatives including increasing his Aimovig dose or trying Vyepti infusions.  We can also consider Botox.    He will follow-up with me in 4 months time so we can reassess how he is doing with his Aimovig preventive or he will call me sooner if needed.

## 2019-07-23 NOTE — Assessment & Plan Note (Signed)
Neurologic exam is normal.  He is having 25 HA days per month; 8 of these being more severe and debilitating migraines.  He did start Aimovig 70 mg injections in January but due to insurance he was unable to continue the medication.  We now have his Aimovig PA in place and resumed Aimovig 70 this month.  1.  Continue Aimovig 70 mg injection once monthly for at least 3 consecutive months and then we will reevaluate if we need to increase his dosage, he is not having any side effects at this time and denies constipation.  We discussed that we really need to focus on headache prevention so we can reduce his Fiorinal use.  2.  I am refilling his Fiorinal, 20 tabs per month the prescription was provided by Dr. Jeff Benjamin.  I did check the [place] PMP aware site.  3.  We discussed that ultimately we need to wean him off of Fiorinal as he does admit to building up tolerance to the medication and feels like he is requiring higher doses.  In the end I believe this is going to worsen his headaches and he understands.  4.  I have provided him with both Ubrelvy 100 mg tablets, may take 2 tablets daily.  This was a sample and he will let me know if it is effective.  5.  I have also provided him with Nurtec 75 mg sample and he is aware to only use 1 tab per 24 hours.  He will let me know which medication he prefers either Ubrelvy or Nurtec and I will provide him with a prescription at that time.  6.  We discussed other migraine preventatives including increasing his Aimovig dose or trying Vyepti infusions.  We can also consider Botox.    He will follow-up with me in 4 months time so we can reassess how he is doing with his Aimovig preventive or he will call me sooner if needed.

## 2019-07-23 NOTE — Patient Instructions (Signed)
MEDICATIONS:     1 - continue aimovig 70 mg monthly injections  2- try Ubrelvy 100 mg tabs, may use up to 200 mg / 24 hrs.  If Bernita Raisin not effective; may use fiorinal.   3- try Nurtec 75 mg tab, may use 1 tab/24 hrs.  If Nurtec not effective, may use fiorinal.  4- do not use ubelvy and nurtec in the same day.   5. Contact me with which medication you prefer, if any, and I will send in prescription.

## 2019-07-23 NOTE — Progress Notes (Signed)
Patient: Benjamin [name]  MRN: [id number]  DOB: Nov 20, 1965    CC: Headaches    HPI:   @ is a 53 y.o.yo male here for follow up and continued management of chronic migraine headaches with aura.     Medication changes last office visit: trial of ubrelvy    Headaches were down to 12 per month, which is good for him; however in the last 2 months headaches are almost daily, approximately 25 HA days/month.  HA are not as severe.      Less Severe HA: bifrontal, throbbing and sharp, using fiorinal for these.     More severe migraine: bifrontal, usually in the morning, throbbing/sharp; can last all day, but with fiorinal is 2-3 hours duration; associated with sound sensitivity; dizziness.      He is no longer using Goodys.     Current HA days per month: 25 HA days/month; 8 of these are the more severe debilitating migraines - uses fiorinal, will take one in the morning and one in the evening.     HA Prophylaxis: January was his last dose of aimovig; he just resumed Aimovig this month. No side effects at this point, no constipation.      Rescue med: fiorinal;     Medications tried in the past: emgality, indomethacin, ibuprofen, toradol injection, sumatriptan (diarrhea ), zomig-not effective, timolol, venlafaxine, amitriptyline     Associated medical problems: sleep is poor, he is able to go to sleep but awakens after a few hours, melatonin helps. Depression - controlled with zoloft 50 mg monthly.    Previous Imaging: Previous Neurologist Dr Adriana Mccallum note of 05/10/2018 notes MRI/MRA brain was nromal           Review of Systems   Review of Systems   Constitutional: Negative.    HENT: Negative.    Eyes: Negative.    Respiratory: Negative.    Cardiovascular: Negative.    Gastrointestinal: Positive for abdominal pain and nausea.   Genitourinary: Negative.    Musculoskeletal: Negative.    Skin: Negative.    Neurological: Positive for dizziness and headaches.   Endo/Heme/Allergies: Negative.     Psychiatric/Behavioral: Positive for depression.       Past Medical History:  Past Medical History:   Diagnosis Date   ??? Depression 2013    mild   ??? Migraines 2013     No past surgical history on file.  Family History   Problem Relation Age of Onset   ??? Stroke Mother         mild   ??? Thyroid Disease Mother         hypothyroidism   ??? Heart Failure Mother    ??? Heart Disease Father    ??? Heart Attack Father    ??? Other Maternal Grandmother         angina   ??? No Known Problems Maternal Grandfather    ??? Cancer Paternal Grandmother    ??? Cancer Paternal Grandfather    ??? Other Sister 58        Staph infection/ Guillian Barre   ??? Other Brother         quadraplegic due to MVA   ??? MS Brother    ??? No Known Problems Son    ??? No Known Problems Son    ??? No Known Problems Son    ??? No Known Problems Daughter        Current Medications:  Current Outpatient Medications   Medication Sig Dispense Refill   ???  codeine-butalbital-acetaminophen-caffeine (FIORICET WITH CODEINE) 50-325-40-30 mg capsule TAKE 1 CAPSULE BY MOUTH EVERY 8 HOURS. MAX: 3 CAPSULES PER DAY 20 Cap 0   ??? erenumab-aooe (Aimovig Autoinjector) 70 mg/mL injection 1 mL by SubCUTAneous route every thirty (30) days. 1 Each 11   ??? ondansetron (ZOFRAN ODT) 4 mg disintegrating tablet Take 1 Tab by mouth every eight (8) hours as needed for Nausea or Vomiting. 30 Tab 1   ??? sertraline (ZOLOFT) 50 mg tablet Take 50 mg by mouth daily.         Allergies:  No Known Allergies    Social History:  Social History     Socioeconomic History   ??? Marital status: MARRIED     Spouse name: Not on file   ??? Number of children: Not on file   ??? Years of education: Not on file   ??? Highest education level: Not on file   Tobacco Use   ??? Smoking status: Never Smoker   ??? Smokeless tobacco: Never Used   Substance and Sexual Activity   ??? Alcohol use: No   ??? Drug use: No   ??? Sexual activity: Yes     Partners: Female     Birth control/protection: Surgical       Family History:  Family History    Problem Relation Age of Onset   ??? Stroke Mother         mild   ??? Thyroid Disease Mother         hypothyroidism   ??? Heart Failure Mother    ??? Heart Disease Father    ??? Heart Attack Father    ??? Other Maternal Grandmother         angina   ??? No Known Problems Maternal Grandfather    ??? Cancer Paternal Grandmother    ??? Cancer Paternal Grandfather    ??? Other Sister [id number]        Staph infection/ Guillian Barre   ??? Other Brother         quadraplegic due to MVA   ??? MS Brother    ??? No Known Problems Son    ??? No Known Problems Son    ??? No Known Problems Son    ??? No Known Problems Daughter        Vital Signs:  Visit Vitals  Ht 5\' 9"  (1.753 m)   Wt 202 lb (91.6 kg)   BMI 29.83 kg/m??       Physical Exam:  General:  No acute distress.  Head: atraumatic. normocephalic.  Heart: Regular rate/rhythm; without murmur  Respiratory: non-labored respirations, without cough/audible wheeze  Neurological:  Mental Status: Alert and oriented x 3  Cranial Nerves:   I- deferred  II, III, IV, VI-Pupils equal, round and reactive to light.  Extraocular movements intact.  Visual fields full.   V-Facial sensation intact to light touch V1-V3  VII-Face symmetric  VIII-Hearing intact to finger rub  IX-deferred  X, XII-Speech clear and fluent. Tongue midline  XI-Shoulder shrug strong and symmetric    Sensation: intact to light touch in bilateral upper and lower extremities  Strength: normal tone.  5/5 strength in bilateral deltoids, biceps, interossei, hip flexors, knee extensors, knee flexors, ankle flexion and dorsiflexion.  Without tremor.   Cerebellar function: Finger-to-nose bilateral upper extremities intact.  Negative Romberg  Reflexes: deep tendon reflexes- 2+ bilateral biceps, brachioradialis,  patellar, ankle jerks.    Gait: casual steady      Assessment/ Plan:    Diagnoses  and all orders for this visit:    1. Migraine with aura and without status migrainosus, not intractable  Assessment & Plan:   Neurologic exam is normal.  He is having 25 HA days per month; 8 of these being more severe and debilitating migraines.  He did start Aimovig 70 mg injections in January but due to insurance he was unable to continue the medication.  We now have his Aimovig PA in place and resumed Aimovig 70 this month.  1.  Continue Aimovig 70 mg injection once monthly for at least 3 consecutive months and then we will reevaluate if we need to increase his dosage, he is not having any side effects at this time and denies constipation.  We discussed that we really need to focus on headache prevention so we can reduce his Fiorinal use.  2.  I am refilling his Fiorinal, 20 tabs per month the prescription was provided by Dr. Merry Proud.  I did check the [place] PMP aware site.  3.  We discussed that ultimately we need to wean him off of Fiorinal as he does admit to building up tolerance to the medication and feels like he is requiring higher doses.  In the end I believe this is going to worsen his headaches and he understands.  4.  I have provided him with both Ubrelvy 100 mg tablets, may take 2 tablets daily.  This was a sample and he will let me know if it is effective.  5.  I have also provided him with Nurtec 75 mg sample and he is aware to only use 1 tab per 24 hours.  He will let me know which medication he prefers either [place] or Nurtec and I will provide him with a prescription at that time.  6.  We discussed other migraine preventatives including increasing his Aimovig dose or trying Vyepti infusions.  We can also consider Botox.    He will follow-up with me in 4 months time so we can reassess how he is doing with his Aimovig preventive or he will call me sooner if needed.    Orders:  -     ondansetron (ZOFRAN ODT) 4 mg disintegrating tablet; Take 1 Tab by mouth every eight (8) hours as needed for Nausea or Vomiting.  -     codeine-butalbital-acetaminophen-caffeine (FIORICET WITH CODEINE)  50-325-40-30 mg capsule; Take 1 Cap by mouth two (2) times daily as needed for Headache for up to 30 days. Max Daily Amount: 2 Caps.            I have spent greater than 50% of the patient's 35 minute visit  in counseling for importance of medication adherence, hydration, avoidance of medication overuse, proper use of any rescue and preventive medications, and importance of good sleep hygiene.     Patient is to continue all other medications as directed by prescribing physicians unless addressed above in plan. Continuation of these medications from today's visit are made based on the patient's report of current medications.       Gwenlyn Fudge NP  [facility] [place] [address]  Phone: [phone]  Fax: [phone]

## 2019-07-29 ENCOUNTER — Encounter: Payer: BLUE CROSS/BLUE SHIELD | Attending: Acute Care | Primary: Family

## 2019-10-17 ENCOUNTER — Encounter

## 2019-10-17 MED ORDER — AIMOVIG AUTOINJECTOR 70 MG/ML SUBCUTANEOUS AUTO-INJECTOR
70 mg/mL | SUBCUTANEOUS | 11 refills | Status: DC
Start: 2019-10-17 — End: 2020-01-26

## 2019-10-17 NOTE — Telephone Encounter (Signed)
Request completed.  Will close this encounter.

## 2019-10-17 NOTE — Telephone Encounter (Signed)
FYI--Aimovig approved until 2.12.22 PA Case: [id number]

## 2019-11-17 ENCOUNTER — Encounter

## 2019-11-17 MED ORDER — CODEINE-BUTALBITAL-ACETAMINOPHEN-CAFFEINE 30 MG-50 MG-325 MG-40 MG CAP
50-325-40-30 mg | ORAL_CAPSULE | ORAL | 0 refills | Status: DC
Start: 2019-11-17 — End: 2019-12-29

## 2019-11-18 ENCOUNTER — Encounter: Payer: BLUE CROSS/BLUE SHIELD | Attending: Acute Care | Primary: Family

## 2019-11-21 NOTE — Telephone Encounter (Incomplete)
PRESCRIPTION REFILL REQUEST:         MEDICATION: amovig     DOSE: ***    FREQUENCY: ***    QUANTITY: ***    REFILLS: ***    Is patent currently out of medication?  {Yes No:21500}      NAME OF PHARMACY: ***    PHARMACY PHONE NUMBER: ***    Benjamin [name]  11/21/19  11:53 AM

## 2019-11-24 ENCOUNTER — Encounter

## 2019-11-24 NOTE — Telephone Encounter (Signed)
Filled one week ago

## 2019-11-25 ENCOUNTER — Encounter: Payer: BLUE CROSS/BLUE SHIELD | Attending: Acute Care | Primary: Family

## 2019-12-29 ENCOUNTER — Encounter

## 2019-12-30 MED ORDER — ONDANSETRON 4 MG TAB, RAPID DISSOLVE
4 mg | ORAL_TABLET | Freq: Three times a day (TID) | ORAL | 1 refills | Status: AC | PRN
Start: 2019-12-30 — End: ?

## 2019-12-30 MED ORDER — CODEINE-BUTALBITAL-ACETAMINOPHEN-CAFFEINE 30 MG-50 MG-325 MG-40 MG CAP
50-325-40-30 mg | ORAL_CAPSULE | ORAL | 0 refills | Status: DC
Start: 2019-12-30 — End: 2020-01-27

## 2019-12-30 NOTE — Telephone Encounter (Signed)
Request submitted via MYCHART.    Benjamin [name] patient who has a follow up scheduled on 6/15 with Benjamin [name].

## 2020-01-26 ENCOUNTER — Encounter

## 2020-01-26 MED ORDER — AIMOVIG AUTOINJECTOR 70 MG/ML SUBCUTANEOUS AUTO-INJECTOR
70 mg/mL | SUBCUTANEOUS | 11 refills | Status: AC
Start: 2020-01-26 — End: ?

## 2020-01-26 NOTE — Telephone Encounter (Signed)
Needs to wait a week. Than I will refill. Have them call back. 20 tabs is for 30 days.

## 2020-01-26 NOTE — Telephone Encounter (Signed)
Request submitted via MYCHART

## 2020-01-26 NOTE — Telephone Encounter (Signed)
Needs to wait a week. Than I will refill. Have them call back. 20 tabs is for 30 days.

## 2020-01-27 ENCOUNTER — Encounter

## 2020-01-27 NOTE — Telephone Encounter (Signed)
Called and left message for pt. Informed him of Dr. Sharlet Salina response.

## 2020-01-27 NOTE — Telephone Encounter (Signed)
Request submitted via MYCHART

## 2020-01-27 NOTE — Telephone Encounter (Signed)
Called and left message for pt. Informed him of Dr. Benjamin response.

## 2020-01-28 MED ORDER — CODEINE-BUTALBITAL-ACETAMINOPHEN-CAFFEINE 30 MG-50 MG-325 MG-40 MG CAP
50-325-40-30 mg | ORAL_CAPSULE | ORAL | 0 refills | Status: AC
Start: 2020-01-28 — End: 2020-02-27

## 2020-02-17 ENCOUNTER — Encounter: Attending: Acute Care | Primary: Family

## 2020-03-15 ENCOUNTER — Encounter

## 2020-03-15 MED ORDER — CODEINE-BUTALBITAL-ACETAMINOPHEN-CAFFEINE 30 MG-50 MG-325 MG-40 MG CAP
50-325-40-30 mg | ORAL_CAPSULE | ORAL | 0 refills | Status: AC
Start: 2020-03-15 — End: 2020-04-14

## 2020-04-30 ENCOUNTER — Encounter

## 2020-04-30 MED ORDER — CODEINE-BUTALBITAL-ACETAMINOPHEN-CAFFEINE 30 MG-50 MG-325 MG-40 MG CAP
50-325-40-30 mg | ORAL_CAPSULE | ORAL | 0 refills | Status: AC | PRN
Start: 2020-04-30 — End: 2020-05-03

## 2020-04-30 NOTE — Telephone Encounter (Signed)
I cannot tell in the chart when we last ordered this prescription.  I also can tell when his follow-up appointment is.  Can you call the pharmacy and find out when he was last filled this product.

## 2020-04-30 NOTE — Telephone Encounter (Signed)
Per [employer].    Patient Benjamin [name] was last filled and picked up on 03/16/20 #20. No refills    You were the prescribed and have been since 08/2018

## 2020-04-30 NOTE — Progress Notes (Signed)
No further prescriptions after this 1 until he is seen in the office have not seen him since 2019.  He has to make these 20 last until I see him.  Unfortunately this is not an emergent case.

## 2020-04-30 NOTE — Telephone Encounter (Signed)
Per patient chart review..    Patient last seen by Benjamin [name] on 07/23/19.      Cancelled appts:  02/17/20  11/25/19  11/18/19  07/29/19    Patient has no F/U scheduled.

## 2020-05-05 NOTE — Telephone Encounter (Signed)
Left message to schedule a follow up appointment, pt will not be given any more refills because he has not been seen since 2019.

## 2020-06-21 NOTE — Telephone Encounter (Signed)
Left message to schedule a follow up with Dr. Sharlet Salina in order to refill medications.

## 2020-06-21 NOTE — Telephone Encounter (Signed)
-----   Message from Sheryle Spray, DO sent at 03/15/2020  1:21 PM EDT -----  Regarding: fu  He needs a fu apt with me he is a FB pt . I sent rx in but if no shows than no further rx

## 2020-12-21 ENCOUNTER — Encounter

## 2020-12-21 MED ORDER — CODEINE-BUTALBITAL-ACETAMINOPHEN-CAFFEINE 30 MG-50 MG-325 MG-40 MG CAP
50-325-40-30 mg | ORAL_CAPSULE | ORAL | 0 refills | Status: AC | PRN
Start: 2020-12-21 — End: 2020-12-24

## 2021-02-23 ENCOUNTER — Ambulatory Visit
Admit: 2021-02-23 | Discharge: 2021-02-23 | Payer: BLUE CROSS/BLUE SHIELD | Attending: Neurology | Primary: Family Medicine

## 2021-02-23 ENCOUNTER — Encounter: Attending: Neurology | Primary: Family

## 2021-02-23 DIAGNOSIS — G43709 Chronic migraine without aura, not intractable, without status migrainosus: Secondary | ICD-10-CM

## 2021-02-23 MED ORDER — RIZATRIPTAN BENZOATE 10 MG PO TBDP
10 | ORAL_TABLET | Freq: Once | ORAL | 9 refills | Status: AC | PRN
Start: 2021-02-23 — End: 2021-02-23

## 2021-02-23 MED ORDER — BUTALBITAL-ASA-CAFFEINE 50-325-40 MG PO CAPS
50-325-40 MG | ORAL_CAPSULE | ORAL | 3 refills | Status: AC | PRN
Start: 2021-02-23 — End: 2021-03-25

## 2021-02-23 NOTE — Progress Notes (Signed)
[name]  [address], [name], [place] [id number]  Phone: [phone] Fax [phone]  Dr. Amador CunasJeff Rafael Salway      02/23/2021  [name]     Patient is referred by the following provider for consultation regarding as below:       I reviewed the available records and notes and have examined patient with the following findings:     Chief Complaint:  No chief complaint on file.         HPI: This is a right handed 55 y.o. male who is very pleasant very appropriate patient who unfortunately started having headaches about [age] years of age so was about 7 to 8 years ago.  He is a Environmental education officerpracticing attorney and was being worked up and treated by Dr. Andy GaussFay Blazek one of our nurse practitioners.  The patient is currently getting about a daily headache every day of the week which is his dull kind of annoying headache that he can work through and to deal with.  On top of that he gets 5 migraines per month and he states that this is an abrupt onset of bitemporal/frontal sharp pain without photophobia but was severe phonophobia and nausea.  But he no longer is vomiting he used to in the past.  He is currently on Emgality and has been getting samples.  He has Fiorinal where we usually give him 15 to 20/month.  And he also goes back to BC's to over-the-counter medicines to help break the headache.  He is already tried with Dr. Butch PennyBate Blazek Ubrelvy Nurtec indomethacin ibuprofen Imitrex Toradol Zomig Effexor Elavil and atenolol or beta-blockers.  He has not tried Botox for prevention as of yet.  He said an MRI and MRA of his head done in the past which was normal.  He had unfortunately canceled quite a few appointments so we were not able to fill some of his medications but now that he we have been seen we can move forward.    IMAGING REVIEW:  I REVIEWED PERTINENT  IMAGES AND REPORTS WITH THE PATIENT PERSONALLY, DIRECTLY AND FULLY.     Past Medical History:  Past Medical History:   Diagnosis Date   ??? Depression  2013    mild   ??? Migraines 2013       Past Surgical History:  No past surgical history on file.    Social History:  Social History     Socioeconomic History   ??? Marital status: Married     Spouse name: Not on file   ??? Number of children: Not on file   ??? Years of education: Not on file   ??? Highest education level: Not on file   Occupational History   ??? Not on file   Tobacco Use   ??? Smoking status: Never Smoker   ??? Smokeless tobacco: Never Used   Substance and Sexual Activity   ??? Alcohol use: No   ??? Drug use: No   ??? Sexual activity: Not on file   Other Topics Concern   ??? Not on file   Social History Narrative   ??? Not on file     Social Determinants of Health     Financial Resource Strain:    ??? Difficulty of Paying Living Expenses: Not on file   Food Insecurity:    ??? Worried About Running Out of Food in the Last Year: Not on file   ??? Ran Out of Food in the Last  Year: Not on file   Transportation Needs:    ??? Lack of Transportation (Medical): Not on file   ??? Lack of Transportation (Non-Medical): Not on file   Physical Activity:    ??? Days of Exercise per Week: Not on file   ??? Minutes of Exercise per Session: Not on file   Stress:    ??? Feeling of Stress : Not on file   Social Connections:    ??? Frequency of Communication with Friends and Family: Not on file   ??? Frequency of Social Gatherings with Friends and Family: Not on file   ??? Attends Religious Services: Not on file   ??? Active Member of Clubs or Organizations: Not on file   ??? Attends Banker Meetings: Not on file   ??? Marital Status: Not on file   Intimate Partner Violence:    ??? Fear of Current or Ex-Partner: Not on file   ??? Emotionally Abused: Not on file   ??? Physically Abused: Not on file   ??? Sexually Abused: Not on file   Housing Stability:    ??? Unable to Pay for Housing in the Last Year: Not on file   ??? Number of Places Lived in the Last Year: Not on file   ??? Unstable Housing in the Last Year: Not on file       Family History:   Family History   Problem  Relation Age of Onset   ??? Stroke Mother         mild   ??? Thyroid Disease Mother         hypothyroidism   ??? Heart Failure Mother    ??? Heart Disease Father    ??? Heart Attack Father    ??? Other Maternal Grandmother         angina   ??? No Known Problems Maternal Grandfather    ??? Cancer Paternal Grandmother    ??? Cancer Paternal Grandfather    ??? Other Sister 55        Staph infection/ Guillian Barre   ??? Other Brother         quadraplegic due to MVA   ??? Mult Sclerosis Brother    ??? No Known Problems Son    ??? No Known Problems Son    ??? No Known Problems Son    ??? No Known Problems Daughter        Current Outpatient Medications on File Prior to Visit   Medication Sig Dispense Refill   ??? Erenumab-aooe (AIMOVIG) 70 MG/ML SOAJ Inject 70 mg into the skin every 30 days     ??? ondansetron (ZOFRAN-ODT) 4 MG disintegrating tablet Take 4 mg by mouth every 8 hours as needed     ??? sertraline (ZOLOFT) 50 MG tablet Take 50 mg by mouth daily       No current facility-administered medications on file prior to visit.       Not on File    Review of Systems:  Review of Systems   Constitutional: Negative.    HENT: Negative.    Eyes: Negative.    Respiratory: Negative.    Cardiovascular: Negative.    Gastrointestinal: Negative.    Endocrine: Negative.    Genitourinary: Negative.    Musculoskeletal: Negative.    Skin: Negative.    Allergic/Immunologic: Negative.    Neurological: Positive for headaches.      No flowsheet data found.  No flowsheet data found.       There  were no vitals filed for this visit.     Physical Exam  Constitutional:       Appearance: Normal appearance.   HENT:      Head: Normocephalic and atraumatic.   Eyes:      Extraocular Movements: Extraocular movements intact and EOM normal.      Pupils: Pupils are equal, round, and reactive to light.   Cardiovascular:      Rate and Rhythm: Normal rate and regular rhythm.      Pulses: Normal pulses.   Pulmonary:      Effort: Pulmonary effort is normal.   Abdominal:      General: Abdomen  is flat.   Neurological:      Mental Status: He is alert and oriented to person, place, and time.      Gait: Gait is intact.      Deep Tendon Reflexes:      Reflex Scores:       Tricep reflexes are 2+ on the right side and 2+ on the left side.       Bicep reflexes are 2+ on the right side and 2+ on the left side.       Brachioradialis reflexes are 2+ on the right side and 2+ on the left side.       Patellar reflexes are 2+ on the right side and 2+ on the left side.       Achilles reflexes are 1+ on the right side and 1+ on the left side.         Neurologic Exam     Mental Status   Oriented to person, place, and time.   Attention: normal. Concentration: normal.   Level of consciousness: alert  Knowledge: good.     Cranial Nerves     CN II   Visual fields full to confrontation.     CN III, IV, VI   Pupils are equal, round, and reactive to light.  Extraocular motions are normal.     CN VII   Facial expression full, symmetric.     Motor Exam   Right arm tone: normal  Left arm tone: normal  Right leg tone: normal  Left leg tone: normal    Gait, Coordination, and Reflexes     Gait  Gait: normal    Tremor   Resting tremor: absent  Intention tremor: absent  Action tremor: absent    Reflexes   Right brachioradialis: 2+  Left brachioradialis: 2+  Right biceps: 2+  Left biceps: 2+  Right triceps: 2+  Left triceps: 2+  Right patellar: 2+  Left patellar: 2+  Right achilles: 1+  Left achilles: 1+          Assessment   Assessment / Plan:    Diagnoses and all orders for this visit:    Chronic migraine without aura without status migrainosus, not intractable the patient is going to continue Emgality we have tried him on Maxalt we will refill Fiorinal for 15 a month and were to set him up for Botox at this time is more than indicated.  -     butalbital-aspirin-caffeine (FIORINAL) 50-325-40 MG per capsule; Take 1 capsule by mouth every 8-12 hours as needed for Headaches for up to 30 days.        The Diagnosis and differential  diagnostic considerations, and Rx Tx were reviewed with the patient at length.           No orders of the defined types were  placed in this encounter.         I have spent greater than 50% of visit discussing and counseling of patient  for treatment and diagnostic plan review. Total time30 minf     .      Notes: Patient is to continue all medications as directed by prescribing physicians. Continuations on today's visit are made based on the patient's report of current medications.             Dr. Amador Cunas  Consultation Neurology, Neurodiagnostics and Neurotherapeutics  Neuroelectrophysiology, EEG, EMG  [facility]  [address]  [place], [place] [address]  Phone:  [phone]  Fax:   [phone]

## 2021-02-23 NOTE — Telephone Encounter (Signed)
Opened in error

## 2021-04-28 ENCOUNTER — Encounter: Payer: BLUE CROSS/BLUE SHIELD | Attending: Neurology | Primary: Family Medicine

## 2021-04-28 NOTE — Progress Notes (Deleted)
[facility]  [address]  [place], [place] [address]  Phone: [phone] Fax [phone]  Dr. Elmo Putt        Patient: [name]  Provider: Elmo Putt, MD     Procedure note:  Botulinum Toxin injections     Indication: Chronic Migraine        Subjective:      Patient presents for chemodenervation for chronic migraine.  Referred by Dr. Sharlet Salina.  This will be his first injection.    ***       Past medical history, surgical history, social history, family history, medications and allergies were all reviewed and updated as appropriate.      Outpatient Encounter Medications as of 04/28/2021   Medication Sig Dispense Refill    butalbital-aspirin-caffeine (FIORINAL) 50-325-40 MG per capsule Take 1 capsule by mouth every 8-12 hours as needed for Headaches for up to 30 days. 15 capsule 3    rizatriptan (MAXALT-MLT) 10 MG disintegrating tablet Take 1 tablet by mouth once as needed for Migraine May repeat in 2 hours if needed 9 tablet 9    Erenumab-aooe (AIMOVIG) 70 MG/ML SOAJ Inject 70 mg into the skin every 30 days      ondansetron (ZOFRAN-ODT) 4 MG disintegrating tablet Take 4 mg by mouth every 8 hours as needed      sertraline (ZOLOFT) 50 MG tablet Take 50 mg by mouth daily       No facility-administered encounter medications on file as of 04/28/2021.           Exam:      Visit Vitals  There were no vitals filed for this visit.    General Exam:  General - Well developed, well nourished, in no apparent distress.   HENT - Normocephalic, atraumatic. Oropharynx clear.   Eyes - Sclera anicteric.  External Ears - Appear normal.   Neck - No visualized mass.   Lungs - Breathing is non-labored.   Skin - No significant lesions or discoloration noted.    Musculoskeletal - Moves all extremities. No deformities noted.   Psychiatric - Normal affect. No hallucinations.      Neurological Exam:      MS/Language/Speech - Alert.  Attentive and cooperative. Language fluent. Speech is clear.      Cranial  Nerves - Eye movements full. No nystagmus. No facial asymmetry. Tongue was midline. Shoulder shrug symmetric.     Motor - Moves all extremities well with no apparent limitations. No tremor present.      Cerebellar - No ataxia or dysmetria.      Gait - Unremarkable.      Assessment/Plan:      Brewer Hitchman is a 55 y.o.male who presents for chemodenervation for chronic migraine.     - Proceed with botulinum toxin injections as noted below.     - RTC 3 months for review and re-injection.       Procedure:       Consent: Written consent obtained after the potential risks and benefits have been explained to the patient.  Potential risks include:  Pain, bruising, bleeding, infection, flu-like symptoms, over-weakening of injected or adjacent muscles and swallowing dysfunction. Patient was given the botulinum toxin medication guide according to FDA standards.    Technique: Botox A 200 unit was dissolved into non-preservative normal saline 4 ml. Gauge 30 facial needles were used for the injection.     Procedure: Five units/ 0.1 ml per site unless specified. Occipitalis R3  L3. Cervical paraspinal R2 L2. Trapezius R3 L3. Temporalis R4 L4. Frontalis R2 L2. Procerus 1. Corrugators R1 L1.       Total injected: *** BOTOX 100units/2 cc.   Waste: *** units     Comments: There were no complications.  The patient tolerated the procedure well.        Signature: Charlestine Massed, MD  Date: 04/27/21  [facility]   [address], [address]  [place], [place] [address]  Ph: [phone]  Fax: [phone]

## 2021-06-14 NOTE — Telephone Encounter (Signed)
BOTOX 200 UNITS

## 2021-06-23 ENCOUNTER — Encounter: Payer: BLUE CROSS/BLUE SHIELD | Attending: Neurology | Primary: Family Medicine

## 2021-11-28 ENCOUNTER — Encounter: Payer: BLUE CROSS/BLUE SHIELD | Attending: Neurology | Primary: Family Medicine
